# Patient Record
Sex: Female | Born: 1935 | Race: Black or African American | Hispanic: No | Marital: Single | Smoking: Never smoker
Health system: Southern US, Community
[De-identification: ages and names within clinical notes are randomized; demographics above are authoritative.]

## PROBLEM LIST (undated history)

## (undated) DIAGNOSIS — F0390 Unspecified dementia without behavioral disturbance: Secondary | ICD-10-CM

## (undated) DIAGNOSIS — I152 Hypertension secondary to endocrine disorders: Secondary | ICD-10-CM

## (undated) DIAGNOSIS — E1169 Type 2 diabetes mellitus with other specified complication: Secondary | ICD-10-CM

## (undated) DIAGNOSIS — E119 Type 2 diabetes mellitus without complications: Secondary | ICD-10-CM

## (undated) DIAGNOSIS — N1832 Chronic kidney disease, stage 3b: Secondary | ICD-10-CM

---

## 2020-09-14 ENCOUNTER — Emergency Department (HOSPITAL_COMMUNITY)
Admission: EM | Admit: 2020-09-14 | Discharge: 2020-09-14 | Disposition: A | Discharge status: Home / Self Care | Payer: Medicare PPO | Attending: Emergency Medicine | Admitting: Emergency Medicine

## 2020-09-14 ENCOUNTER — Emergency Department (HOSPITAL_COMMUNITY): Payer: Medicare PPO

## 2020-09-14 ENCOUNTER — Other Ambulatory Visit: Payer: Self-pay

## 2020-09-14 DIAGNOSIS — S8011XA Contusion of right lower leg, initial encounter: Secondary | ICD-10-CM | POA: Insufficient documentation

## 2020-09-14 DIAGNOSIS — F0281 Dementia in other diseases classified elsewhere with behavioral disturbance: Secondary | ICD-10-CM | POA: Diagnosis not present

## 2020-09-14 DIAGNOSIS — S8012XA Contusion of left lower leg, initial encounter: Secondary | ICD-10-CM | POA: Insufficient documentation

## 2020-09-14 DIAGNOSIS — S300XXA Contusion of lower back and pelvis, initial encounter: Secondary | ICD-10-CM | POA: Diagnosis not present

## 2020-09-14 DIAGNOSIS — S0083XA Contusion of other part of head, initial encounter: Secondary | ICD-10-CM | POA: Insufficient documentation

## 2020-09-14 DIAGNOSIS — W19XXXA Unspecified fall, initial encounter: Secondary | ICD-10-CM | POA: Insufficient documentation

## 2020-09-14 DIAGNOSIS — S0990XA Unspecified injury of head, initial encounter: Secondary | ICD-10-CM | POA: Diagnosis present

## 2020-09-14 DIAGNOSIS — G309 Alzheimer's disease, unspecified: Secondary | ICD-10-CM | POA: Insufficient documentation

## 2020-09-14 DIAGNOSIS — Y9289 Other specified places as the place of occurrence of the external cause: Secondary | ICD-10-CM | POA: Insufficient documentation

## 2020-09-14 DIAGNOSIS — R7989 Other specified abnormal findings of blood chemistry: Secondary | ICD-10-CM | POA: Diagnosis not present

## 2020-09-14 DIAGNOSIS — R059 Cough, unspecified: Secondary | ICD-10-CM | POA: Insufficient documentation

## 2020-09-14 DIAGNOSIS — R0981 Nasal congestion: Secondary | ICD-10-CM | POA: Diagnosis not present

## 2020-09-14 DIAGNOSIS — R296 Repeated falls: Secondary | ICD-10-CM

## 2020-09-14 LAB — CBC WITH DIFFERENTIAL/PLATELET
Abs Immature Granulocytes: 0.03 10*3/uL (ref 0.00–0.07)
Basophils Absolute: 0 10*3/uL (ref 0.0–0.1)
Basophils Relative: 0 %
Eosinophils Absolute: 0.3 10*3/uL (ref 0.0–0.5)
Eosinophils Relative: 4 %
HCT: 31.9 % — ABNORMAL LOW (ref 36.0–46.0)
Hemoglobin: 9.9 g/dL — ABNORMAL LOW (ref 12.0–15.0)
Immature Granulocytes: 0 %
Lymphocytes Relative: 8 %
Lymphs Abs: 0.6 10*3/uL — ABNORMAL LOW (ref 0.7–4.0)
MCH: 27.3 pg (ref 26.0–34.0)
MCHC: 31 g/dL (ref 30.0–36.0)
MCV: 87.9 fL (ref 80.0–100.0)
Monocytes Absolute: 0.5 10*3/uL (ref 0.1–1.0)
Monocytes Relative: 6 %
Neutro Abs: 6 10*3/uL (ref 1.7–7.7)
Neutrophils Relative %: 82 %
Platelets: 298 10*3/uL (ref 150–400)
RBC: 3.63 MIL/uL — ABNORMAL LOW (ref 3.87–5.11)
RDW: 16.7 % — ABNORMAL HIGH (ref 11.5–15.5)
WBC: 7.5 10*3/uL (ref 4.0–10.5)
nRBC: 0 % (ref 0.0–0.2)

## 2020-09-14 LAB — URINALYSIS, ROUTINE W REFLEX MICROSCOPIC
Bilirubin Urine: NEGATIVE
Glucose, UA: NEGATIVE mg/dL
Hgb urine dipstick: NEGATIVE
Ketones, ur: NEGATIVE mg/dL
Leukocytes,Ua: NEGATIVE
Nitrite: NEGATIVE
Protein, ur: >=300 mg/dL — AB
Specific Gravity, Urine: 1.012 (ref 1.005–1.030)
pH: 6 (ref 5.0–8.0)

## 2020-09-14 LAB — COMPREHENSIVE METABOLIC PANEL
ALT: 29 U/L (ref 0–44)
AST: 51 U/L — ABNORMAL HIGH (ref 15–41)
Albumin: 2.9 g/dL — ABNORMAL LOW (ref 3.5–5.0)
Alkaline Phosphatase: 56 U/L (ref 38–126)
Anion gap: 9 (ref 5–15)
BUN: 23 mg/dL (ref 8–23)
CO2: 23 mmol/L (ref 22–32)
Calcium: 8.5 mg/dL — ABNORMAL LOW (ref 8.9–10.3)
Chloride: 111 mmol/L (ref 98–111)
Creatinine, Ser: 2.09 mg/dL — ABNORMAL HIGH (ref 0.44–1.00)
GFR, Estimated: 23 mL/min — ABNORMAL LOW (ref >60)
Glucose, Bld: 150 mg/dL — ABNORMAL HIGH (ref 70–99)
Potassium: 4.6 mmol/L (ref 3.5–5.1)
Sodium: 143 mmol/L (ref 135–145)
Total Bilirubin: 0.7 mg/dL (ref 0.3–1.2)
Total Protein: 6 g/dL — ABNORMAL LOW (ref 6.5–8.1)

## 2020-09-14 NOTE — Discharge Instructions (Addendum)
You were seen in the ED for back and arm pain, recent fall  ER work-up was reassuring.  Your hemoglobin showed mild anemia and it was 9.9.  Your creatinine was elevated and it was 2.09, GFR was 23.  Your BUN was normal.  Encourage oral fluids and keep hydrated   We do not have available records to know if the above lab findings are at your baseline however they do not appear to be severely abnormal  Please have primary care doctor follow-up for repeat lab work in and repeat evaluation to ensure symptoms are improving

## 2020-09-14 NOTE — ED Notes (Signed)
Patient family member verbalizes understanding of discharge instructions. Opportunity for questioning and answers were provided. Armband removed by staff, pt discharged from ED.

## 2020-09-14 NOTE — ED Triage Notes (Signed)
Patient arrives to ED with complaints of fall on Thursday at [facility]. Pt has hematoma to upper left forehead, no thinners. Per patient she has been feeling weak and had a cough over the past week.

## 2020-09-14 NOTE — ED Provider Notes (Signed)
[facility] EMERGENCY DEPARTMENT Provider Note   CSN: [id number] Arrival date & time: 09/14/20  6599     History Chief Complaint  Patient presents with  . Fall  . Weakness    Diana Tran is a 86 y.o. female with history of Alzheimer's dementia is brought to the ED by her daughter for evaluation of back and arm pains.  Onset 2 days ago.  Two days ago patient had a fall at [place] [facility] day program.  Staff saw patient fall forward.  She goes to day program at the memory care unit with her husband who also has dementia.  She has a bruise in the left forehead.  Patient started complaining of back and arm pain the day of the fall.  Daughter states every time she gets up or is touched in the back she says "they are broken" and complains of pain.  Patient has been given Tylenol.  Daughter states patient had been dealing with a cough and chest congestion for about a month.  Was getting DayQuil and NyQuil.  Seen recently by primary care doctor who prescribed her antibiotics and promethazine last week, completed.  Cough has actually significantly improved.  No fevers.  Daughter is concerned given recent cough and now complaints of back pain that may be patient has pneumonia.  However, states her cough has improved and she has not had any fevers.  Daughter reports patient has long history of frequent falls.  Had a hip fracture recently and just finished PT.  Patient has several bruises in her buttocks and legs.  Daughter states patient felt 3 times from her bed last night which is not atypical for her.  Patient gets all of her care through [facility].  Daughter is currently moving to [facility] to be closer to patient and her father and assist with her care.  Patient otherwise appears to be at her baseline.  Typically she is only oriented to her name.  Has been eating and drinking okay.  Normal urine and bowel movement output.  No blood thinners.  Remote history of  cigarette use 50 years ago.  No known lung disease.  Patient is fully vaccinated and had a negative Covid test 2 days ago. Level 5 caveat due to dementia. Patient can tell me her name only, tells me the woman next to her is her older sister but she is in fact her daughter.    HPI     No past medical history on file.  There are no problems to display for this patient.   ** The histories are not reviewed yet. Please review them in the "History" navigator section and refresh this [place].   OB History   No obstetric history on file.     No family history on file.  Social History   Tobacco Use  . Smoking status: Not on file  Substance Use Topics  . Alcohol use: Not on file  . Drug use: Not on file    Home Medications Prior to Admission medications   Not on File    Allergies    Patient has no known allergies.  Review of Systems   Review of Systems  Unable to perform ROS: Dementia  All other systems reviewed and are negative.   Physical Exam Updated Vital Signs BP 120/77 (BP Location: Left Arm)   Pulse 64   Temp 97.8 F (36.6 C) (Axillary)   Resp 16   Ht 5\' 2"  (1.575 m)  Wt 49.9 kg   SpO2 97%   BMI 20.12 kg/m   Physical Exam Vitals and nursing note reviewed.  Constitutional:      Appearance: She is well-developed.     Comments: Nontoxic, pleasantly confused.  HENT:     Head: Normocephalic and atraumatic.     Nose: Nose normal.  Eyes:     Conjunctiva/sclera: Conjunctivae normal.  Cardiovascular:     Rate and Rhythm: Normal rate and regular rhythm.  Pulmonary:     Effort: Pulmonary effort is normal.     Breath sounds: Normal breath sounds.  Abdominal:     General: Bowel sounds are normal.     Palpations: Abdomen is soft.     Tenderness: There is no abdominal tenderness.     Comments: No G/R/R. No suprapubic or CVA tenderness. Negative Murphy's and McBurney's. Active BS to lower quadrants.   Musculoskeletal:        General: Normal range of  motion.     Cervical back: Normal range of motion.     Comments: Patient able to sit up independently on the bed.  No reproducible CTL spine midline or paraspinal muscle tenderness, contusions.  No palpable abnormalities or reproducible tenderness of the upper extremities, shoulders, scapula, APL thorax.  Passive range of motion of all upper/lower extremities without any pain.  Skin:    General: Skin is warm and dry.     Capillary Refill: Capillary refill takes less than 2 seconds.     Comments: Several small ecchymotic areas in the buttocks and lower legs, in different stages of healing.  Neurological:     Mental Status: She is alert. She is disoriented.     Comments: Patient able to tell me her name.  Disoriented to year, place and her daughter.  Does attempt to follow some simple commands.  5/5 strength with handgrip.  Patient able to lift both legs off the bed without any drift or weakness.  Cranial nerves intact bilaterally, visual fields unable to be tested due to patient cooperation.  Finger-to-nose and heel-to-shin unable to be tested due to patient cooperation.  Speech is clear.  Psychiatric:        Behavior: Behavior normal.     ED Results / Procedures / Treatments   Labs (all labs ordered are listed, but only abnormal results are displayed) Labs Reviewed  CBC WITH DIFFERENTIAL/PLATELET - Abnormal; Notable for the following components:      Result Value   RBC 3.63 (*)    Hemoglobin 9.9 (*)    HCT 31.9 (*)    RDW 16.7 (*)    Lymphs Abs 0.6 (*)    All other components within normal limits  COMPREHENSIVE METABOLIC PANEL - Abnormal; Notable for the following components:   Glucose, Bld 150 (*)    Creatinine, Ser 2.09 (*)    Calcium 8.5 (*)    Total Protein 6.0 (*)    Albumin 2.9 (*)    AST 51 (*)    GFR, Estimated 23 (*)    All other components within normal limits  URINALYSIS, ROUTINE W REFLEX MICROSCOPIC - Abnormal; Notable for the following components:   Protein, ur >=300  (*)    Bacteria, UA RARE (*)    All other components within normal limits  URINE CULTURE    EKG EKG Interpretation  Date/Time:  Sunday September 14 2020 09:43:42 EDT Ventricular Rate:  56 PR Interval:    QRS Duration: 87 QT Interval:  485 QTC Calculation: 469 R Axis:   -  50 Text Interpretation: Sinus rhythm Left anterior fascicular block Minimal ST depression no prior for comparison No STEMI Confirmed by Nanda Quinton [phone]) on 09/14/2020 9:53:45 AM   Radiology DG Chest 2 View  Result Date: 09/14/2020 CLINICAL DATA:  Back pain.  Cough. EXAM: CHEST - 2 VIEW COMPARISON:  None. FINDINGS: Normal heart size and mediastinal contours. Artifact from EKG pads. No acute infiltrate or edema. No effusion or pneumothorax. No acute osseous findings; the spine is better visualized on dedicated images. Degenerative endplate spurring and disc narrowing which is diffuse. IMPRESSION: No active cardiopulmonary disease. Electronically Signed   By: Monte Fantasia M.D.   On: 09/14/2020 10:45   DG Thoracic Spine 2 View  Result Date: 09/14/2020 CLINICAL DATA:  Back pain, cough.  Recent fall. EXAM: THORACIC SPINE 2 VIEWS COMPARISON:  None. FINDINGS: Moderate dextroscoliosis of the mid/upper thoracic spine. Wedging of multiple vertebral bodies within the midthoracic spine, with associated thoracic spine kyphosis, most likely chronic given the associated degenerative spurring and endplate sclerosis. No acute appearing fracture or subluxation. Visualized paravertebral soft tissues are unremarkable. IMPRESSION: 1. No acute findings. No acute appearing fracture or subluxation within the thoracic spine. 2. Chronic-appearing wedging of multiple vertebral bodies within the midthoracic spine, with associated thoracic spine kyphosis. 3. Moderate dextroscoliosis of the mid/upper thoracic spine. 4. Degenerative changes within the mid and upper thoracic spine, mild to moderate in degree. Electronically Signed   By: Franki Cabot  M.D.   On: 09/14/2020 10:49   DG Lumbar Spine Complete  Result Date: 09/14/2020 CLINICAL DATA:  Fall, back pain. EXAM: LUMBAR SPINE - COMPLETE 4+ VIEW COMPARISON:  None. FINDINGS: Moderate S-shaped scoliosis of the thoracolumbar spine. Mild vertebral body subluxations at the L3 and L4 levels, likely related to underlying degenerative change. No evidence of acute vertebral body subluxation. No fracture line or displaced fracture fragment. Degenerative spondylosis throughout the lumbar spine, at least moderate in degree with associated disc space narrowings, osseous spurring and endplate sclerosis. Additional degenerative hypertrophy of the facet joints at multiple levels. Aortic atherosclerosis. Visualized paravertebral soft tissues are otherwise unremarkable. IMPRESSION: 1. No acute findings. No fracture or evidence of acute subluxation seen within the lumbar spine. 2. Moderate scoliosis. 3. Degenerative spondylosis of the lumbar spine, at least moderate in degree. 4. Aortic atherosclerosis. Electronically Signed   By: Franki Cabot M.D.   On: 09/14/2020 10:51   CT Head Wo Contrast  Result Date: 09/14/2020 CLINICAL DATA:  Fall, hematoma at upper LEFT forehead. EXAM: CT HEAD WITHOUT CONTRAST TECHNIQUE: Contiguous axial images were obtained from the base of the skull through the vertex without intravenous contrast. COMPARISON:  None. FINDINGS: Brain: Generalized age related parenchymal volume loss with commensurate dilatation of the ventricles and sulci. Chronic small vessel ischemic changes within the bilateral periventricular and subcortical white matter regions. No mass, hemorrhage, edema or other evidence of acute parenchymal abnormality. No extra-axial hemorrhage. Vascular: Chronic calcified atherosclerotic changes of the large vessels at the skull base. No unexpected hyperdense vessel. Skull: Normal. Negative for fracture or focal lesion. Sinuses/Orbits: No acute finding. Other: None. IMPRESSION: 1. No  acute findings. No intracranial mass, hemorrhage or edema. No skull fracture. 2. Chronic small vessel ischemic changes in the white matter. Electronically Signed   By: Franki Cabot M.D.   On: 09/14/2020 10:53    Procedures Procedures (including critical care time)  Medications Ordered in ED Medications - No data to display  ED Course  I have reviewed the triage vital signs and the  nursing notes.  Pertinent labs & imaging results that were available during my care of the patient were reviewed by me and considered in my medical decision making (see chart for details).  Clinical Course as of Sep 14 1252  Sun Sep 14, 2020  1130 Hemoglobin(!): 9.9 [CG]  1131 HCT(!): 31.9 [CG]  1131 Creatinine(!): 2.09 [CG]  1131 Bacteria, UA(!): RARE [CG]    Clinical Course User Index [CG] Arlean Hopping   MDM Rules/Calculators/A&P                          86 year old female with history of dementia and frequent falls presents to the ED for back and arm pains after a mechanical fall 2 days ago.  History obtained from daughter who is at bedside.   No available medical records, patient is relocating and gets the majority of her care at [facility].  Exam is benign.  Afebrile.  Vital signs normal.  Unable to reproduce tenderness on exam.  No signs of severe trauma.  Patient is able to sit up move around in bed.  Neurological exam is limited due to dementia but grossly reassuring.  I ordered lab work including CBC, CMP, urinalysis and urine culture.  I have ordered imaging studies including head CT, lumbar thoracic spine x-rays, chest x-ray, EKG.  ER work-up personally visualized and interpreted.  ER work-up reveals anemia hemoglobin 9.9.  Creatinine 2.09, GFR 23 but normal BUN.  Chest x-ray without acute findings.  Urinalysis with rare bacteria only.  X-rays and head CT without acute findings.  Discussed with patient's daughter lab findings.  States in the past she has had to be admitted  for acute kidney injury but does not know patient's baseline creatinine or hemoglobin.  These may be her baseline.  Reevaluated patient and patient has been stable, no clinical decline.  Vital signs remain normal.  Given benign exam, lab work patient will be discharged.  Recommended close PCP follow-up for repeat lab work, reassessment.  Daughter is in agreement with plan of care.  Shared with EDP long who evaluated patient and agrees with ER management and discharge plan.   Final Clinical Impression(s) / ED Diagnoses Final diagnoses:  Frequent falls  Elevated serum creatinine  Contusion of forehead, initial encounter    Rx / DC Orders ED Discharge Orders    None       Kinnie Feil, PA-C 09/14/20 1253    Long, W[facility], MD 09/15/20 1350

## 2020-09-15 LAB — URINE CULTURE: Culture: NO GROWTH

## 2020-09-24 ENCOUNTER — Emergency Department (HOSPITAL_COMMUNITY): Payer: Medicare PPO

## 2020-09-24 ENCOUNTER — Encounter (HOSPITAL_COMMUNITY): Payer: Self-pay | Admitting: Physician Assistant

## 2020-09-24 ENCOUNTER — Emergency Department (HOSPITAL_COMMUNITY)
Admission: EM | Admit: 2020-09-24 | Discharge: 2020-09-24 | Disposition: A | Discharge status: Home / Self Care | Payer: Medicare PPO | Attending: Emergency Medicine | Admitting: Emergency Medicine

## 2020-09-24 ENCOUNTER — Other Ambulatory Visit: Payer: Self-pay

## 2020-09-24 DIAGNOSIS — R404 Transient alteration of awareness: Secondary | ICD-10-CM

## 2020-09-24 DIAGNOSIS — Z79899 Other long term (current) drug therapy: Secondary | ICD-10-CM | POA: Diagnosis not present

## 2020-09-24 DIAGNOSIS — R4182 Altered mental status, unspecified: Secondary | ICD-10-CM | POA: Diagnosis present

## 2020-09-24 DIAGNOSIS — I10 Essential (primary) hypertension: Secondary | ICD-10-CM | POA: Insufficient documentation

## 2020-09-24 DIAGNOSIS — F0390 Unspecified dementia without behavioral disturbance: Secondary | ICD-10-CM | POA: Insufficient documentation

## 2020-09-24 LAB — URINALYSIS, ROUTINE W REFLEX MICROSCOPIC
Bacteria, UA: NONE SEEN
Bilirubin Urine: NEGATIVE
Glucose, UA: 50 mg/dL — AB
Hgb urine dipstick: NEGATIVE
Ketones, ur: NEGATIVE mg/dL
Leukocytes,Ua: NEGATIVE
Nitrite: NEGATIVE
Protein, ur: >=300 mg/dL — AB
Specific Gravity, Urine: 1.011 (ref 1.005–1.030)
pH: 7 (ref 5.0–8.0)

## 2020-09-24 LAB — COMPREHENSIVE METABOLIC PANEL
ALT: 21 U/L (ref 0–44)
AST: 20 U/L (ref 15–41)
Albumin: 2.9 g/dL — ABNORMAL LOW (ref 3.5–5.0)
Alkaline Phosphatase: 63 U/L (ref 38–126)
Anion gap: 11 (ref 5–15)
BUN: 29 mg/dL — ABNORMAL HIGH (ref 8–23)
CO2: 22 mmol/L (ref 22–32)
Calcium: 8.6 mg/dL — ABNORMAL LOW (ref 8.9–10.3)
Chloride: 109 mmol/L (ref 98–111)
Creatinine, Ser: 1.86 mg/dL — ABNORMAL HIGH (ref 0.44–1.00)
GFR, Estimated: 26 mL/min — ABNORMAL LOW (ref >60)
Glucose, Bld: 212 mg/dL — ABNORMAL HIGH (ref 70–99)
Potassium: 4.6 mmol/L (ref 3.5–5.1)
Sodium: 142 mmol/L (ref 135–145)
Total Bilirubin: 0.4 mg/dL (ref 0.3–1.2)
Total Protein: 6.1 g/dL — ABNORMAL LOW (ref 6.5–8.1)

## 2020-09-24 LAB — CBC WITH DIFFERENTIAL/PLATELET
Abs Immature Granulocytes: 0.02 10*3/uL (ref 0.00–0.07)
Basophils Absolute: 0 10*3/uL (ref 0.0–0.1)
Basophils Relative: 1 %
Eosinophils Absolute: 0.2 10*3/uL (ref 0.0–0.5)
Eosinophils Relative: 3 %
HCT: 33.6 % — ABNORMAL LOW (ref 36.0–46.0)
Hemoglobin: 10.2 g/dL — ABNORMAL LOW (ref 12.0–15.0)
Immature Granulocytes: 0 %
Lymphocytes Relative: 13 %
Lymphs Abs: 0.9 10*3/uL (ref 0.7–4.0)
MCH: 27 pg (ref 26.0–34.0)
MCHC: 30.4 g/dL (ref 30.0–36.0)
MCV: 88.9 fL (ref 80.0–100.0)
Monocytes Absolute: 0.4 10*3/uL (ref 0.1–1.0)
Monocytes Relative: 6 %
Neutro Abs: 5.1 10*3/uL (ref 1.7–7.7)
Neutrophils Relative %: 77 %
Platelets: 357 10*3/uL (ref 150–400)
RBC: 3.78 MIL/uL — ABNORMAL LOW (ref 3.87–5.11)
RDW: 16.5 % — ABNORMAL HIGH (ref 11.5–15.5)
WBC: 6.6 10*3/uL (ref 4.0–10.5)
nRBC: 0 % (ref 0.0–0.2)

## 2020-09-24 NOTE — ED Notes (Signed)
Pt disconnected self from monitor and pulled out PIV. Pt reoriented and placed back on cardiac monitor. No PIV at this time.

## 2020-09-24 NOTE — ED Notes (Signed)
In order to ensure pt safety and keep patient from wandering, pt sitting at nurses station with mask in place.

## 2020-09-24 NOTE — ED Notes (Signed)
Pt disconnected self from monitor.

## 2020-09-24 NOTE — ED Notes (Addendum)
Pt discharged ambulatory with daughter. All questions and concerns addressed. No complaints at this time.

## 2020-09-24 NOTE — ED Triage Notes (Signed)
BIB [facility] coming from adult daycare facility. Staff noticed that after eating lunch she became unresponsive for about 7 minutes. Staff did sternum rub and the patient came to. Per EMS she is not able to hold a good conversation.  EMS reports AO x1 to self which is baseline. Staff became concerned because while she usually does doze off she is usually easily woken up.   BP-178/94 CBG-284 HR-80s Resp- 18

## 2020-09-24 NOTE — Discharge Instructions (Addendum)
Please follow up with your primary care provider within 5-7 days for re-evaluation of your symptoms. If you do not have a primary care provider, information for a healthcare clinic has been provided for you to make arrangements for follow up care. Please return to the emergency department for any new or worsening symptoms. ° °

## 2020-09-24 NOTE — ED Notes (Signed)
Pt daughter here. Pt placed back in ED05 with daughter at bedside. Will notify provider.

## 2020-09-24 NOTE — ED Provider Notes (Signed)
  Face-to-face evaluation   History: Elderly female, at daycare today when she was sleepy and caregivers had difficulty arousing her.  Physical exam: Elderly, confused, no apparent distress.  She is removing stickers in leads, from her body.  She is pleasant but quite confused.  No dysarthria or aphasia.  Medical screening examination/treatment/procedure(s) were conducted as a shared visit with non-physician practitioner(s) and myself.  I personally evaluated the patient during the encounter    Daleen Bo, MD 09/24/20 2200

## 2020-09-24 NOTE — ED Notes (Signed)
Pt disconnected self from cardiac monitor. Pt reoriented

## 2020-09-24 NOTE — ED Notes (Signed)
Pt resting in stretcher with no complaints at this time. Pt is oriented to self only-- baseline per adult daycare staff.   Pt hypertensive-- hooked up to cardiac monitor all other VS WDL

## 2020-09-24 NOTE — ED Provider Notes (Signed)
[facility] EMERGENCY DEPARTMENT Provider Note   CSN: [id number] Arrival date & time: 09/24/20  1228     History Chief Complaint  Patient presents with  . Altered Mental Status    Diana Tran is a 86 y.o. female.  HPI    86 y/o female with a h/o dementia who presents to the ED today for eval of an episode of nonresponsiveness.   The patient has a history of dementia and therefore a level 5 caveat applies.  Per EMS patient had an episode of nonresponsiveness that lasted about 7 minutes.  She had some hypertension and hyperglycemia in route but otherwise vital signs are reassuring.  1:24 PM Discussed case with the patients daughter, Rosalene Billings who states that patient has not been taking her medications for the last 2 days. She has had to hide her medications in her foods. She was noted to have elevated Cr at that visit and daughter is concerned that she has not been drinking water.  She has been eating normally.  She made a conference call to the facility so that I could obtain more information.  See below.  1:33 PM discussed case with patient's facility, [facility].  They state that the patient was not in activity and was falling asleep on and off for several hours.  When they tried to wake her up to get her to go to lunch she had difficulty waking up and was nonresponsive to sternal rub for about 7 to 10 minutes.  When she became responsive she was acting her normal self and was in no acute distress.  Her vital signs were stable at the time of their evaluation but they sent her here for further evaluation.   History reviewed. No pertinent past medical history.  There are no problems to display for this patient.   History reviewed. No pertinent surgical history.   OB History   No obstetric history on file.     History reviewed. No pertinent family history.  Social History   Tobacco Use  . Smoking status: Not on file  Substance Use Topics  .  Alcohol use: Not on file  . Drug use: Not on file    Home Medications Prior to Admission medications   Medication Sig Start Date End Date Taking? Authorizing Provider  amLODipine (NORVASC) 5 MG tablet Take 5 mg by mouth daily. 09/03/20  Yes [provider]  atorvastatin (LIPITOR) 40 MG tablet Take 40 mg by mouth at bedtime.   Yes [provider]  carvedilol (COREG) 6.25 MG tablet Take 6.25 mg by mouth 2 (two) times daily with a meal.   Yes [provider]  LUMIGAN 0.01 % SOLN Place 1 drop into both eyes at bedtime.  07/04/20  Yes [provider]  meclizine (ANTIVERT) 12.5 MG tablet Take 12.5 mg by mouth 2 (two) times daily with a meal.  09/12/20  Yes [provider]  metFORMIN (GLUCOPHAGE) 500 MG tablet Take 500 mg by mouth daily with breakfast.  09/02/20  Yes [provider]  solifenacin (VESICARE) 5 MG tablet Take 5 mg by mouth daily. 09/15/20  Yes [provider]    Allergies    Amlodipine, Galantamine, and Memantine  Review of Systems   Review of Systems  Unable to perform ROS: Dementia  Respiratory: Negative for cough and shortness of breath.   Cardiovascular: Negative for chest pain.  Gastrointestinal: Negative for abdominal pain.  Neurological: Negative for headaches.    Physical Exam  Updated Vital Signs BP (!) 176/89   Pulse 61   Temp 97.9 F (36.6 C) (Oral)   Resp 14   Ht 5\' 2"  (1.575 m)   Wt 49.9 kg   SpO2 100%   BMI 20.12 kg/m   Physical Exam Vitals and nursing note reviewed.  Constitutional:      General: She is not in acute distress.    Appearance: She is well-developed.  HENT:     Head: Normocephalic and atraumatic.  Eyes:     Conjunctiva/sclera: Conjunctivae normal.  Cardiovascular:     Rate and Rhythm: Normal rate and regular rhythm.     Heart sounds: Normal heart sounds. No murmur heard.   Pulmonary:     Effort: Pulmonary effort is normal. No respiratory distress.     Breath sounds:  Normal breath sounds.  Abdominal:     General: Bowel sounds are normal.     Palpations: Abdomen is soft.     Tenderness: There is no abdominal tenderness. There is no guarding or rebound.  Musculoskeletal:     Cervical back: Neck supple.  Skin:    General: Skin is warm and dry.  Neurological:     Mental Status: She is alert.     Comments: Oriented to self.  Pleasantly demented.  Moving all extremities with normal strength and sensation throughout.  Ambulatory with steady gait.  No facial droop.  Clear speech.     ED Results / Procedures / Treatments   Labs (all labs ordered are listed, but only abnormal results are displayed) Labs Reviewed  CBC WITH DIFFERENTIAL/PLATELET - Abnormal; Notable for the following components:      Result Value   RBC 3.78 (*)    Hemoglobin 10.2 (*)    HCT 33.6 (*)    RDW 16.5 (*)    All other components within normal limits  COMPREHENSIVE METABOLIC PANEL - Abnormal; Notable for the following components:   Glucose, Bld 212 (*)    BUN 29 (*)    Creatinine, Ser 1.86 (*)    Calcium 8.6 (*)    Total Protein 6.1 (*)    Albumin 2.9 (*)    GFR, Estimated 26 (*)    All other components within normal limits  URINALYSIS, ROUTINE W REFLEX MICROSCOPIC - Abnormal; Notable for the following components:   Glucose, UA 50 (*)    Protein, ur >=300 (*)    All other components within normal limits    EKG None  Radiology CT Head Wo Contrast  Result Date: 09/24/2020 CLINICAL DATA:  Altered mental status. EXAM: CT HEAD WITHOUT CONTRAST TECHNIQUE: Contiguous axial images were obtained from the base of the skull through the vertex without intravenous contrast. COMPARISON:  September 14, 2020. FINDINGS: Brain: Mild diffuse cortical atrophy is noted. Mild chronic ischemic white matter disease is noted. No mass effect or midline shift is noted. Ventricular size is within normal limits. There is no evidence of mass lesion, hemorrhage or acute infarction. Vascular: No  hyperdense vessel or unexpected calcification. Skull: Normal. Negative for fracture or focal lesion. Sinuses/Orbits: No acute finding. Other: None. IMPRESSION: Mild diffuse cortical atrophy. Mild chronic ischemic white matter disease. No acute intracranial abnormality seen. Electronically Signed   By: Marijo Conception M.D.   On: 09/24/2020 14:48   DG Chest Port 1 View  Result Date: 09/24/2020 CLINICAL DATA:  Syncope.  Altered mental status. EXAM: PORTABLE CHEST 1 VIEW COMPARISON:  09/14/2020. FINDINGS: Similar cardiomediastinal silhouette. Aortic atherosclerosis. No consolidation. Linear left basilar  opacities. No visible pleural effusions or pneumothorax. Degenerative changes of the thoracic spine and bilateral shoulders. IMPRESSION: Left basilar atelectasis. Otherwise, no acute cardiopulmonary abnormality. Electronically Signed   By: Margaretha Sheffield MD   On: 09/24/2020 12:59    Procedures Procedures (including critical care time)  Medications Ordered in ED Medications - No data to display  ED Course  I have reviewed the triage vital signs and the nursing notes.  Pertinent labs & imaging results that were available during my care of the patient were reviewed by me and considered in my medical decision making (see chart for details).    MDM Rules/Calculators/A&P                          86 year old female presented emergency department today for evaluation of a transient alteration of awareness.  She was nonresponsive for several minutes after being asleep on and off for the last 2 hours prior to this event.  She was back to baseline immediately upon arousal.  Her vital signs here are generally reassuring although she is somewhat hypertensive.  Daughter admits the patient has not been taking her medication unless it is hidden food.  Reviewed/interpreted labs.  Patient mildly anemic, no leukocytosis.  Elevated BUN/creatinine but improved from prior labs 10 days ago.  CMP otherwise reassuring.   UA without evidence of UTI.  EKG without ischemic changes, arrhythmia or other abnormality.  Chest x-ray shows some atelectasis but no pneumonia.  CT head is without any acute findings.  Patient has been up ambulating in the department in no acute distress.  She has been eating and drinking normally here in the ED.  Discussed work-up and plan with patient's daughter at bedside.  She is concerned about the patient's health status given this is her second ED visit in the last 2 weeks.  I offered admission for further monitoring due to concern for possible syncope though she declined.  Also offered to consult transitions of care to help set up home health and she declined this as well.  She would prefer to take the patient home and monitor her from there.  Advised on close PCP follow-up and strict return precautions.  She voiced understanding the plan was to return.  All Questions answered.  Patient stable for discharge.   Final Clinical Impression(s) / ED Diagnoses Final diagnoses:  Transient alteration of awareness    Rx / DC Orders ED Discharge Orders    None       Rodney Booze, PA-C 09/24/20 1653    Daleen Bo, MD 09/24/20 2201

## 2020-09-24 NOTE — ED Notes (Signed)
Pt found by staff member wandering hallway in bra and pants. Pt placed back in room. Safety sitter ordered; none available at this time per staffing office.

## 2021-06-13 ENCOUNTER — Other Ambulatory Visit: Payer: Self-pay

## 2021-06-13 ENCOUNTER — Emergency Department (HOSPITAL_COMMUNITY): Payer: Medicare PPO

## 2021-06-13 ENCOUNTER — Emergency Department (HOSPITAL_COMMUNITY)
Admission: EM | Admit: 2021-06-13 | Discharge: 2021-06-13 | Disposition: A | Discharge status: Home / Self Care | Payer: Medicare PPO | Attending: Emergency Medicine | Admitting: Emergency Medicine

## 2021-06-13 DIAGNOSIS — Z79899 Other long term (current) drug therapy: Secondary | ICD-10-CM | POA: Diagnosis not present

## 2021-06-13 DIAGNOSIS — R29810 Facial weakness: Secondary | ICD-10-CM | POA: Insufficient documentation

## 2021-06-13 DIAGNOSIS — R220 Localized swelling, mass and lump, head: Secondary | ICD-10-CM | POA: Diagnosis not present

## 2021-06-13 DIAGNOSIS — R600 Localized edema: Secondary | ICD-10-CM

## 2021-06-13 DIAGNOSIS — F0390 Unspecified dementia without behavioral disturbance: Secondary | ICD-10-CM | POA: Diagnosis not present

## 2021-06-13 DIAGNOSIS — I639 Cerebral infarction, unspecified: Secondary | ICD-10-CM | POA: Insufficient documentation

## 2021-06-13 LAB — I-STAT CHEM 8, ED
BUN: 35 mg/dL — ABNORMAL HIGH (ref 8–23)
Calcium, Ion: 1.14 mmol/L — ABNORMAL LOW (ref 1.15–1.40)
Chloride: 109 mmol/L (ref 98–111)
Creatinine, Ser: 2.3 mg/dL — ABNORMAL HIGH (ref 0.44–1.00)
Glucose, Bld: 166 mg/dL — ABNORMAL HIGH (ref 70–99)
HCT: 32 % — ABNORMAL LOW (ref 36.0–46.0)
Hemoglobin: 10.9 g/dL — ABNORMAL LOW (ref 12.0–15.0)
Potassium: 4.5 mmol/L (ref 3.5–5.1)
Sodium: 142 mmol/L (ref 135–145)
TCO2: 23 mmol/L (ref 22–32)

## 2021-06-13 LAB — CBC
HCT: 31.3 % — ABNORMAL LOW (ref 36.0–46.0)
Hemoglobin: 9.9 g/dL — ABNORMAL LOW (ref 12.0–15.0)
MCH: 27.5 pg (ref 26.0–34.0)
MCHC: 31.6 g/dL (ref 30.0–36.0)
MCV: 86.9 fL (ref 80.0–100.0)
Platelets: 337 10*3/uL (ref 150–400)
RBC: 3.6 MIL/uL — ABNORMAL LOW (ref 3.87–5.11)
RDW: 15.4 % (ref 11.5–15.5)
WBC: 10 10*3/uL (ref 4.0–10.5)
nRBC: 0 % (ref 0.0–0.2)

## 2021-06-13 LAB — DIFFERENTIAL
Abs Immature Granulocytes: 0.04 10*3/uL (ref 0.00–0.07)
Basophils Absolute: 0 10*3/uL (ref 0.0–0.1)
Basophils Relative: 0 %
Eosinophils Absolute: 0.2 10*3/uL (ref 0.0–0.5)
Eosinophils Relative: 2 %
Immature Granulocytes: 0 %
Lymphocytes Relative: 10 %
Lymphs Abs: 1 10*3/uL (ref 0.7–4.0)
Monocytes Absolute: 0.7 10*3/uL (ref 0.1–1.0)
Monocytes Relative: 7 %
Neutro Abs: 8.1 10*3/uL — ABNORMAL HIGH (ref 1.7–7.7)
Neutrophils Relative %: 81 %

## 2021-06-13 LAB — PROTIME-INR
INR: 1 (ref 0.8–1.2)
Prothrombin Time: 13.2 seconds (ref 11.4–15.2)

## 2021-06-13 LAB — COMPREHENSIVE METABOLIC PANEL
ALT: 14 U/L (ref 0–44)
AST: 15 U/L (ref 15–41)
Albumin: 2.7 g/dL — ABNORMAL LOW (ref 3.5–5.0)
Alkaline Phosphatase: 68 U/L (ref 38–126)
Anion gap: 5 (ref 5–15)
BUN: 36 mg/dL — ABNORMAL HIGH (ref 8–23)
CO2: 25 mmol/L (ref 22–32)
Calcium: 8.5 mg/dL — ABNORMAL LOW (ref 8.9–10.3)
Chloride: 109 mmol/L (ref 98–111)
Creatinine, Ser: 2.43 mg/dL — ABNORMAL HIGH (ref 0.44–1.00)
GFR, Estimated: 19 mL/min — ABNORMAL LOW (ref >60)
Glucose, Bld: 175 mg/dL — ABNORMAL HIGH (ref 70–99)
Potassium: 4.3 mmol/L (ref 3.5–5.1)
Sodium: 139 mmol/L (ref 135–145)
Total Bilirubin: 0.7 mg/dL (ref 0.3–1.2)
Total Protein: 5.9 g/dL — ABNORMAL LOW (ref 6.5–8.1)

## 2021-06-13 LAB — ETHANOL: Alcohol, Ethyl (B): <10 mg/dL (ref <10)

## 2021-06-13 LAB — APTT: aPTT: 26 seconds (ref 24–36)

## 2021-06-13 NOTE — Discharge Instructions (Addendum)
Call your primary care doctor or specialist as discussed in the next 2-3 days.   Return immediately back to the ER if:  Your symptoms worsen within the next 12-24 hours. You develop new symptoms such as new fevers, persistent vomiting, new pain, shortness of breath, or new weakness or numbness, or if you have any other concerns.  

## 2021-06-13 NOTE — ED Triage Notes (Signed)
Pt from home with hx of dementia for eval of facial swelling and droop noticed by family. LSN 2200 last night.

## 2021-06-13 NOTE — ED Provider Notes (Signed)
Emergency Medicine Provider Triage Evaluation Note  Diana Tran , a 87 y.o. female  was evaluated in triage.  Pt complains of left side facial swelling with possible droop.  History provided by daughter, patient with dementia. Last seen normal 10:00 last night.  This morning patient had swelling and slight droop of the left side face.  No other weakness.  Patient is ambulatory and has had normal gait per daughter.  Review of Systems  Positive: L side facial swelling Negative: fever  Physical Exam  There were no vitals taken for this visit. Gen:   Awake, no distress   Resp:  Normal effort  MSK:   Moves extremities without difficulty  Other:  Swelling noted of the left side face, mostly on the left eye.  There is slight asymmetry of the face, however no obvious droop when patient smiles.  However exam is difficult due to dementia.  No clear visual field deficit.  No obvious strength deficit of upper or lower extremities.  Sensation intact x4.  Medical Decision Making  Medically screening exam initiated at 1:09 PM.  Appropriate orders placed.  Diana Tran was informed that the remainder of the evaluation will be completed by another provider, this initial triage assessment does not replace that evaluation, and the importance of remaining in the ED until their evaluation is complete.  Due to possible new facial droop, stroke work up started. But pt not in window for tpa. No lvo   Franchot Heidelberg, PA-C 06/13/21 1311    Luna Fuse, MD 06/13/21 [phone]

## 2021-06-13 NOTE — ED Provider Notes (Signed)
[facility] EMERGENCY DEPARTMENT Provider Note   CSN: [id number] Arrival date & time: 06/13/21  1219     History Chief Complaint  Patient presents with   Facial Droop    Diana Tran is a 87 y.o. female.  Patient with history of severe Alzheimer's living in the memory unit presents with no complaints herself.  However her family is concerned that she has been having some left-sided facial droop which he noticed yesterday when he picked her up from her memory unit.  Patient lives in the memory unit Monday through Friday and the family picks her up for the weekends.  No reports of weakness of the arm or leg.  Patient has a normal gait per family.  No speech deficit.  No reports of any fevers or cough or vomiting or diarrhea.      No past medical history on file.  There are no problems to display for this patient.   No past surgical history on file.   OB History   No obstetric history on file.     No family history on file.     Home Medications Prior to Admission medications   Medication Sig Start Date End Date Taking? Authorizing Provider  amLODipine (NORVASC) 5 MG tablet Take 5 mg by mouth daily. 09/03/20   [provider]  atorvastatin (LIPITOR) 40 MG tablet Take 40 mg by mouth at bedtime.    [provider]  carvedilol (COREG) 6.25 MG tablet Take 6.25 mg by mouth 2 (two) times daily with a meal.    [provider]  LUMIGAN 0.01 % SOLN Place 1 drop into both eyes at bedtime.  07/04/20   [provider]  meclizine (ANTIVERT) 12.5 MG tablet Take 12.5 mg by mouth 2 (two) times daily with a meal.  09/12/20   [provider]  metFORMIN (GLUCOPHAGE) 500 MG tablet Take 500 mg by mouth daily with breakfast.  09/02/20   [provider]  solifenacin (VESICARE) 5 MG tablet Take 5 mg by mouth daily. 09/15/20   [provider]    Allergies    Amlodipine, Galantamine, and Memantine  Review of  Systems   Review of Systems  Unable to perform ROS: Dementia   Physical Exam Updated Vital Signs BP (!) 164/77 (BP Location: Right Arm)   Pulse 65   Temp 98.2 F (36.8 C) (Oral)   Resp 16   Ht 5' (1.524 m)   Wt 52.2 kg   SpO2 98%   BMI 22.46 kg/m   Physical Exam Constitutional:      General: She is not in acute distress.    Appearance: Normal appearance.  HENT:     Head: Normocephalic.     Comments: Patient does have some mild edema to the left upper eyelid and the left maxillary region.  No abnormal warmth no erythema or cellulitis noted no fluctuance noted.    Nose: Nose normal.  Eyes:     Extraocular Movements: Extraocular movements intact.  Cardiovascular:     Rate and Rhythm: Normal rate.  Pulmonary:     Effort: Pulmonary effort is normal.  Musculoskeletal:        General: Normal range of motion.     Cervical back: Normal range of motion.  Neurological:     General: No focal deficit present.     Mental Status: She is alert. Mental status is at baseline.     Comments: Cranial nerves II through XII appear intact.  Strength is 5/5 strength all extremities no drift or weakness noted.    ED Results / Procedures / Treatments   Labs (all labs ordered are listed, but only abnormal results are displayed) Labs Reviewed  CBC - Abnormal; Notable for the following components:      Result Value   RBC 3.60 (*)    Hemoglobin 9.9 (*)    HCT 31.3 (*)    All other components within normal limits  DIFFERENTIAL - Abnormal; Notable for the following components:   Neutro Abs 8.1 (*)    All other components within normal limits  COMPREHENSIVE METABOLIC PANEL - Abnormal; Notable for the following components:   Glucose, Bld 175 (*)    BUN 36 (*)    Creatinine, Ser 2.43 (*)    Calcium 8.5 (*)    Total Protein 5.9 (*)    Albumin 2.7 (*)    GFR, Estimated 19 (*)    All other components within normal limits  I-STAT CHEM 8, ED - Abnormal; Notable for the following components:    BUN 35 (*)    Creatinine, Ser 2.30 (*)    Glucose, Bld 166 (*)    Calcium, Ion 1.14 (*)    Hemoglobin 10.9 (*)    HCT 32.0 (*)    All other components within normal limits  ETHANOL  PROTIME-INR  APTT    EKG None  Radiology CT HEAD WO CONTRAST  Result Date: 06/13/2021 CLINICAL DATA:  Neuro deficit. Stroke suspected. Left-sided facial swelling. Possible facial droop. Patient has history of dementia. EXAM: CT HEAD WITHOUT CONTRAST TECHNIQUE: Contiguous axial images were obtained from the base of the skull through the vertex without intravenous contrast. COMPARISON:  September 24, 2020 FINDINGS: Brain: Prominence of sulci and ventricles is stable since September 24, 2020 consistent with cerebral atrophy. Cerebellum, brainstem, and basal cisterns demonstrate no acute abnormalities. White matter changes are noted. No acute cortical ischemia or infarct are identified. No mass effect or midline shift. Vascular: Calcified atherosclerosis in the intracranial carotids. Skull: Normal. Negative for fracture or focal lesion. Sinuses/Orbits: No acute finding. Other: No other abnormalities. IMPRESSION: 1. Cerebral atrophy and white matter changes. No acute intracranial abnormalities identified. Electronically Signed   By: Dorise Bullion III M.D   On: 06/13/2021 13:47    Procedures Procedures   Medications Ordered in ED Medications - No data to display  ED Course  I have reviewed the triage vital signs and the nursing notes.  Pertinent labs & imaging results that were available during my care of the patient were reviewed by me and considered in my medical decision making (see chart for details).    MDM Rules/Calculators/A&P                             Labs and work-up otherwise unremarkable.  CT imaging of the brain shows no acute findings per radiology.  I discussed at length with family regarding possibility of CVA, I feel this is less likely given she has no real focal deficit and the slight  facial droop on the left side likely due to the edema of the left eyelid and left maxillary region as opposed to her neurologic deficit.  Family states that she does sleep on the left side of her face which I suspect may have caused some edema to developed over time.  I do not suspect cellulitis given the nature of presentation here.  Risks and benefits of sedation for  MRI discussed with family and joint decision made to forego MRI at this time given low pretest probability.  I did discuss aspirin therapy with family, however they state that she had a bad reaction to aspirin they do not recall what it was but are hesitant to be started.  Given I see no clear focal deficit at this time should be discharged home.  Advise outpatient follow-up with her doctors as needed within the week.  I advised immediate return for worsening symptoms or any additional concerns.  Final Clinical Impression(s) / ED Diagnoses Final diagnoses:  Facial edema    Rx / DC Orders ED Discharge Orders     None        Luna Fuse, MD 06/13/21 1438

## 2021-07-10 ENCOUNTER — Emergency Department (HOSPITAL_COMMUNITY)
Admission: EM | Admit: 2021-07-10 | Discharge: 2021-07-10 | Disposition: A | Discharge status: Home / Self Care | Payer: Medicare PPO | Attending: Emergency Medicine | Admitting: Emergency Medicine

## 2021-07-10 ENCOUNTER — Encounter (HOSPITAL_COMMUNITY): Payer: Self-pay | Admitting: Emergency Medicine

## 2021-07-10 DIAGNOSIS — R220 Localized swelling, mass and lump, head: Secondary | ICD-10-CM | POA: Diagnosis not present

## 2021-07-10 DIAGNOSIS — R600 Localized edema: Secondary | ICD-10-CM

## 2021-07-10 MED ORDER — METHYLPREDNISOLONE 4 MG PO TBPK: ORAL_TABLET | ORAL | 0 refills | Status: DC

## 2021-07-10 NOTE — ED Provider Notes (Signed)
[facility] EMERGENCY DEPARTMENT Provider Note   CSN: [id number] Arrival date & time: 07/10/21  [id number]     History Chief Complaint  Patient presents with  . Eye Problem    Diana Tran is a 87 y.o. female.  Patient lives at [facility] and presented by EMS. History of cognitive dysfunction. She presented with right eye swelling that has worsened today. Patient is poor historian due to cognitive function so history primarily obtained by daughter. Daughter states that she has had intermittent swelling underneath her right eye. She states that the swelling also sometimes occurs underneath her left eye, however, today her left eye is normal. No pain with eye movements. No history of trauma to the eye. No eye redness or irritation. No vision changes. No headaches or jaw claudication. No congestion. She was seen in the ED about three weeks ago for a similar episode with concern for a facial droop with a negative workup. She was also seen by her PCP for this issue with no diagnosis or resolution. Denies any change in medication.     Eye Problem Associated symptoms: no discharge, no headaches, no itching, no photophobia and no redness       History reviewed. No pertinent past medical history.  There are no problems to display for this patient.   History reviewed. No pertinent surgical history.   OB History   No obstetric history on file.     History reviewed. No pertinent family history.     Home Medications Prior to Admission medications   Medication Sig Start Date End Date Taking? Authorizing Provider  methylPREDNISolone (MEDROL DOSEPAK) 4 MG TBPK tablet Take per pack instructions. 07/10/21  Yes Aleysha Meckler, Adora Fridge, PA-C  amLODipine (NORVASC) 5 MG tablet Take 5 mg by mouth daily. 09/03/20   [provider]  atorvastatin (LIPITOR) 40 MG tablet Take 40 mg by mouth at bedtime.    [provider]  carvedilol (COREG) 6.25 MG tablet Take 6.25  mg by mouth 2 (two) times daily with a meal.    [provider]  LUMIGAN 0.01 % SOLN Place 1 drop into both eyes at bedtime.  07/04/20   [provider]  meclizine (ANTIVERT) 12.5 MG tablet Take 12.5 mg by mouth 2 (two) times daily with a meal.  09/12/20   [provider]  metFORMIN (GLUCOPHAGE) 500 MG tablet Take 500 mg by mouth daily with breakfast.  09/02/20   [provider]  solifenacin (VESICARE) 5 MG tablet Take 5 mg by mouth daily. 09/15/20   [provider]    Allergies    Amlodipine, Galantamine, and Memantine  Review of Systems   Review of Systems  Constitutional:  Negative for chills and fever.  HENT:  Positive for facial swelling. Negative for congestion, rhinorrhea, sinus pressure, sinus pain and sore throat.   Eyes:  Negative for photophobia, pain, discharge, redness, itching and visual disturbance.  Respiratory: Negative.    Cardiovascular: Negative.   Gastrointestinal: Negative.   Genitourinary: Negative.   Neurological:  Negative for facial asymmetry and headaches.  Psychiatric/Behavioral: Negative.     Physical Exam Updated Vital Signs BP (!) 164/78 (BP Location: Right Arm)   Pulse 62   Temp 98.3 F (36.8 C) (Oral)   Resp 16   SpO2 100%   Physical Exam Vitals and nursing note reviewed.  Constitutional:      General: She is not in acute distress.    Appearance: Normal appearance. She is not toxic-appearing.  HENT:     Head: Normocephalic and atraumatic.     Right Ear: Tympanic membrane, ear canal and external ear normal.     Left Ear: Tympanic membrane, ear canal and external ear normal.     Nose: Nose normal. No congestion or rhinorrhea.     Mouth/Throat:     Mouth: Mucous membranes are moist. No oral lesions.     Pharynx: Oropharynx is clear. No oropharyngeal exudate or posterior oropharyngeal erythema.     Comments: No intra-oral edema.  Eyes:     General: Lids are normal. Gaze aligned appropriately. No  scleral icterus.       Right eye: No foreign body, discharge or hordeolum.        Left eye: No foreign body, discharge or hordeolum.     Extraocular Movements: Extraocular movements intact.     Conjunctiva/sclera: Conjunctivae normal.     Right eye: Right conjunctiva is not injected. No chemosis, exudate or hemorrhage.    Left eye: Left conjunctiva is not injected. No chemosis, exudate or hemorrhage.    Pupils: Pupils are equal, round, and reactive to light.     Comments: Swelling noted inferior to right eye. Non tender fluctuant pocket palpated. No overlying erythema/warmth.   Pulmonary:     Effort: Pulmonary effort is normal. No respiratory distress.  Skin:    General: Skin is warm and dry.  Neurological:     General: No focal deficit present.     Mental Status: She is alert. Mental status is at baseline.  Psychiatric:        Mood and Affect: Mood normal.        Behavior: Behavior normal.     Comments: Baseline cognitively impaired      ED Results / Procedures / Treatments   Labs (all labs ordered are listed, but only abnormal results are displayed) Labs Reviewed - No data to display  EKG None  Radiology No results found.  Procedures Procedures   Medications Ordered in ED Medications - No data to display  ED Course  I have reviewed the triage vital signs and the nursing notes.  Pertinent labs & imaging results that were available during my care of the patient were reviewed by me and considered in my medical decision making (see chart for details).    MDM Rules/Calculators/A&P                         Examined eye. Over the past few weeks, she has developed soft tissue edema underneath her right lower eyelid that has been migratory, alternating between her left and right eye lid. Exam and history not consistent with infection. 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 Reviewed medication list. Not on any ACEi or new medications. Unknown etiology. No concern for  airway compromise or oral angioedema. Attending Dr. Johnney Killian- consulted ophthalmology and spoke to Dr. Kathlen Mody- relayed it could be caused by Lumagan solution Dr. Jarrett Ables relays not to stop this due to no issues with the eye itself. Recommend following up with ophthalmologist outpatient. Will dose with solumedrol and send home with medrol dose pack.   Patient care discussed with attending Dr. Johnney Killian who has personally evaluated the patient as shared visit, provided guidance, & is in agreement.   Final Clinical Impression(s) / ED Diagnoses Final diagnoses:  Right Facial edema    Rx / DC Orders ED Discharge Orders          Ordered  methylPREDNISolone (MEDROL DOSEPAK) 4 MG TBPK tablet        07/10/21 1045             Kunaal Walkins, Adora Fridge, PA-C 07/10/21 1052    Charlesetta Shanks, MD 07/26/21 1431

## 2021-07-10 NOTE — ED Triage Notes (Signed)
Pt arrives from [facility] via EMS, EMS called out for fluid pocket under right eye. Pt hx of dementia, at baseline per staff. No other complains. BP 180/60 HR 62 RR 16 O2 100% RA

## 2021-07-10 NOTE — Discharge Instructions (Addendum)
You were seen in the ED for facial swelling. We are sending you home with a steroid dose pack. Follow the instructions per pharmacy. Please call your ophthalmologist to make an appointment as soon as possible.

## 2021-07-10 NOTE — ED Provider Notes (Signed)
I provided a substantive portion of the care of this patient.  I personally performed the entirety of the history for this encounter.     Patient has had migratory soft tissue swelling by her eyes for the past several weeks.  It was at the left upper and lower lid over a week ago and then resolved and moved to the right lower lid area.  No associated drainage or discharge from the eyes.  Patient has puffy edema in the soft tissues beneath the eyes consistent with small focus of angioedema none present around the airway.  Patient is not on any ACE inhibitor.  Unknown etiology for migratory periorbital angioedema mild to moderate in nature.   Consult: Reviewed with Dr. Kathlen Mody.  Advises it could be related to Lumigan but since the eyes are normal continue the Lumigan until seen by ophthalmology.  Agrees with a Medrol Dosepak and follow-up with ophthalmology within a week   Charlesetta Shanks, MD 07/10/21 1030

## 2021-08-12 ENCOUNTER — Emergency Department (HOSPITAL_COMMUNITY)
Admission: EM | Admit: 2021-08-12 | Discharge: 2021-08-12 | Disposition: A | Discharge status: Home / Self Care | Payer: Medicare PPO | Attending: Emergency Medicine | Admitting: Emergency Medicine

## 2021-08-12 ENCOUNTER — Emergency Department (HOSPITAL_COMMUNITY): Payer: Medicare PPO

## 2021-08-12 DIAGNOSIS — Y92129 Unspecified place in nursing home as the place of occurrence of the external cause: Secondary | ICD-10-CM | POA: Insufficient documentation

## 2021-08-12 DIAGNOSIS — G309 Alzheimer's disease, unspecified: Secondary | ICD-10-CM | POA: Diagnosis not present

## 2021-08-12 DIAGNOSIS — F0280 Dementia in other diseases classified elsewhere without behavioral disturbance: Secondary | ICD-10-CM | POA: Diagnosis not present

## 2021-08-12 DIAGNOSIS — M25532 Pain in left wrist: Secondary | ICD-10-CM

## 2021-08-12 DIAGNOSIS — M25531 Pain in right wrist: Secondary | ICD-10-CM | POA: Insufficient documentation

## 2021-08-12 DIAGNOSIS — W500XXA Accidental hit or strike by another person, initial encounter: Secondary | ICD-10-CM | POA: Insufficient documentation
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
## 2021-08-12 NOTE — Progress Notes (Signed)
Orthopedic Tech Progress Note Patient Details:  Diana Tran 08-28-1932 [id number]  Ortho Devices Type of Ortho Device: Thumb velcro splint Ortho Device/Splint Location: L hand velcro thumb spica splint Ortho Device/Splint Interventions: Ordered, Application, Adjustment   Post Interventions Patient Tolerated: Well Instructions Provided:  (no education, pt sleeping and has dementia)  Pt sleeping during application of L wrist/thumb spica pre fab/velcro splint, pt tolerated well, with no signs of pain.  RN signed drop off form as pt has dementia.  Thanks,  Verdene Lennert, PT, DPT  Acute Rehabilitation Ortho Tech Supervisor [phone] pager #[phone] office     Wells Guiles B Gilberto Stanforth 08/12/2021, 10:31 AM

## 2021-08-12 NOTE — ED Notes (Signed)
Ptar called 
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
## 2021-08-12 NOTE — Discharge Instructions (Addendum)
The x-rays today did not show signs of a fracture of the left wrist.  However, it is still possible she has a small fracture that is not visible on films today.  Therefore, I placed Diana Tran in a wrist splint, which I would recommend she wears at all times (except when bathing).  This is designed for comfort and does not need to be wrapped tightly.  She being she can be given Tylenol for pain.  She will need repeat x-rays of her left wrist in 7-10 days by her PCP or an orthopedic doctor if she continues having pain in her wrist.  She can use her left hand for light activities (holding spoon, etc).

## 2021-10-24 ENCOUNTER — Emergency Department (HOSPITAL_COMMUNITY): Payer: Medicare PPO

## 2021-10-24 ENCOUNTER — Emergency Department (HOSPITAL_COMMUNITY)
Admission: EM | Admit: 2021-10-24 | Discharge: 2021-10-24 | Disposition: A | Discharge status: Home / Self Care | Payer: Medicare PPO | Attending: Emergency Medicine | Admitting: Emergency Medicine

## 2021-10-24 ENCOUNTER — Other Ambulatory Visit: Payer: Self-pay

## 2021-10-24 DIAGNOSIS — R040 Epistaxis: Secondary | ICD-10-CM | POA: Insufficient documentation

## 2021-10-24 DIAGNOSIS — W19XXXA Unspecified fall, initial encounter: Secondary | ICD-10-CM | POA: Diagnosis not present

## 2021-10-24 DIAGNOSIS — Z79899 Other long term (current) drug therapy: Secondary | ICD-10-CM | POA: Diagnosis not present

## 2021-10-24 DIAGNOSIS — S0285XA Fracture of orbit, unspecified, initial encounter for closed fracture: Secondary | ICD-10-CM

## 2021-10-24 DIAGNOSIS — I10 Essential (primary) hypertension: Secondary | ICD-10-CM | POA: Insufficient documentation

## 2021-10-24 DIAGNOSIS — J189 Pneumonia, unspecified organism: Secondary | ICD-10-CM

## 2021-10-24 DIAGNOSIS — R911 Solitary pulmonary nodule: Secondary | ICD-10-CM

## 2021-10-24 DIAGNOSIS — S01112A Laceration without foreign body of left eyelid and periocular area, initial encounter: Secondary | ICD-10-CM | POA: Insufficient documentation

## 2021-10-24 DIAGNOSIS — R0789 Other chest pain: Secondary | ICD-10-CM | POA: Diagnosis not present

## 2021-10-24 DIAGNOSIS — F0390 Unspecified dementia without behavioral disturbance: Secondary | ICD-10-CM | POA: Diagnosis not present

## 2021-10-24 DIAGNOSIS — R93429 Abnormal radiologic findings on diagnostic imaging of unspecified kidney: Secondary | ICD-10-CM

## 2021-10-24 LAB — CBC
HCT: 30.8 % — ABNORMAL LOW (ref 36.0–46.0)
Hemoglobin: 9.8 g/dL — ABNORMAL LOW (ref 12.0–15.0)
MCH: 27.4 pg (ref 26.0–34.0)
MCHC: 31.8 g/dL (ref 30.0–36.0)
MCV: 86 fL (ref 80.0–100.0)
Platelets: 295 10*3/uL (ref 150–400)
RBC: 3.58 MIL/uL — ABNORMAL LOW (ref 3.87–5.11)
RDW: 15 % (ref 11.5–15.5)
WBC: 10.4 10*3/uL (ref 4.0–10.5)
nRBC: 0 % (ref 0.0–0.2)

## 2021-10-24 LAB — BASIC METABOLIC PANEL
Anion gap: 8 (ref 5–15)
BUN: 54 mg/dL — ABNORMAL HIGH (ref 8–23)
CO2: 23 mmol/L (ref 22–32)
Calcium: 8.4 mg/dL — ABNORMAL LOW (ref 8.9–10.3)
Chloride: 107 mmol/L (ref 98–111)
Creatinine, Ser: 3.04 mg/dL — ABNORMAL HIGH (ref 0.44–1.00)
GFR, Estimated: 14 mL/min — ABNORMAL LOW (ref >60)
Glucose, Bld: 139 mg/dL — ABNORMAL HIGH (ref 70–99)
Potassium: 4.7 mmol/L (ref 3.5–5.1)
Sodium: 138 mmol/L (ref 135–145)

## 2021-10-24 MED ORDER — SODIUM CHLORIDE 0.9 % IV BOLUS
500.0000 mL | Freq: Once | INTRAVENOUS | Status: AC
  Administered 2021-10-24: 500 mL via INTRAVENOUS

## 2021-10-24 MED ORDER — ACETAMINOPHEN 325 MG PO TABS
650.0000 mg | ORAL_TABLET | Freq: Once | ORAL | Status: AC
  Administered 2021-10-24: 650 mg via ORAL
  Filled 2021-10-24: qty 2

## 2021-10-24 MED ORDER — AZITHROMYCIN 250 MG PO TABS: 250.0000 mg | ORAL_TABLET | Freq: Every day | ORAL | 0 refills | Status: DC

## 2021-10-24 MED ORDER — CARVEDILOL 12.5 MG PO TABS
12.5000 mg | ORAL_TABLET | Freq: Once | ORAL | Status: AC
  Administered 2021-10-24: 12.5 mg via ORAL
  Filled 2021-10-24: qty 1

## 2021-10-24 MED ORDER — HYDRALAZINE HCL 20 MG/ML IJ SOLN
5.0000 mg | Freq: Once | INTRAMUSCULAR | Status: AC
  Administered 2021-10-24: 5 mg via INTRAVENOUS
  Filled 2021-10-24: qty 1

## 2021-10-24 MED ORDER — HYDRALAZINE HCL 20 MG/ML IJ SOLN: 10.0000 mg | Freq: Once | INTRAMUSCULAR | Status: DC

## 2021-10-24 NOTE — ED Provider Notes (Signed)
I provided a substantive portion of the care of this patient.  I personally performed the entirety of the history, exam, and medical decision making for this encounter.   Patient seen by me along with the physician assistant.  Patient is normally at [facility].  Patient had a fall she normally goes with her family on weekends.  There was no loss of conscious patient not on blood thinners.  Patient suffered minor nosebleed and has a 1/4 inch lack on the left eyebrow area.  Patient has dementia mostly nonverbal that is baseline for her and has a history of hypertension.  Did not have her hypertensive meds this morning.  Patient had CT head neck and maxillofacial.  It showed left orbital floor fracture blood in the maxillary sinus.  This was discussed with on-call maxillofacial.  And they will follow that up in the office.  Also had chest x-ray which raise some abnormal concerns for abnormalities in the chest.  This led to CT of chest without contrast due to her renal function.  Patient's GFR's are normally 20s little bit worse here today at 14.  Potassium is normal at 4.7.  Sodium normal.  CT chest raised the following concerns.  She groundglass nodularities in the right middle lobe and to lesser extent right upper lobe.  These nodules are nearly solid largest measuring about 6 mm fine.  Findings may be related to an atypical infection viral or atypical pneumonia is considered.  But she will need follow-up for these nodules with CT scan in 3 to 6 months.  We will start her on Zithromax in case there is a atypical pneumonia ongoing.  Patient really does not have any significant respiratory findings.  Also there is a mass in the upper pole of the right kidney shows a lobular masslike area that will also need follow-up.  This can be done by primary care doctor probably will need ultrasound.  Also patient's blood pressures have remained elevated here despite being given her Norvasc and Coreg.  Patient will need  follow-up for this and trended.  If it remains high like this probably will require admission.  Patient is okay for for discharge home with family member.      Fredia Sorrow, MD 10/24/21 1321

## 2021-10-24 NOTE — Discharge Instructions (Addendum)
Your CT scan of your head, CT scan of your neck were normal and without fracture or other concerns.  CT scan of the face did show fracture of the left eye, as well as some blood in the left maxillary sinus.  We discussed these findings with maxillofacial surgeon who reviewed your CT scan and at this time does not recommend any intervention.  I have attached his information above.  Please give his office a call Monday to schedule your in clinic follow-up appointment.  Your blood pressure was also elevated today you received your home dose of carvedilol.  Your creatinine (kidney function) was also elevated to 3.04 compared to your baseline which is around 2.4.  We believe this is because of dehydration. we gave you fluids in the emergency room.  I do recommend you follow-up with your primary care provider to have your kidney function rechecked, also to follow-up regarding kidney finding on your CT scan.  CT scan of your chest showed potential pneumonia.  I will start you on antibiotics.  CT scan of your chest also showed nodules which will need to be monitored by your primary care provider.  I have listed ENT information above for you.  1 is Dr. Conley Simmonds who is on-call and is aware of your case and has reviewed your CT scans.  He is updated to see you but he is in [place] and you stated you want someone local.  I have also attached contact information for local ENT that you can call to see if you are able to get in.  Since they are not on-call.  They are not complicated seizure.  Your blood pressure was elevated in the emergency room.  You received your home dose of carvedilol.  He also received an additional 5 mg injection of hydralazine.  As discussed elevated blood pressure along is not concerning but if you develop chest pain, shortness of breath, lightheadedness please return to the emergency room.  You can follow-up with your primary care provider regarding this.  Given your age we also do not want  to correct your blood pressure too quickly, because that can result in drop in your blood pressure and lightheadedness causing additional falls.

## 2021-10-24 NOTE — ED Notes (Signed)
Provider made aware of vitals.

## 2021-10-24 NOTE — ED Triage Notes (Signed)
Patient BIBA EMS from [facility] for a witnessed fall. No LOC, not on blood thinners. Patient suffered minor nosebleed that has resolved. 1/4" lac on L eyebrow.  Hx of dementia, mostly non-verbal. Hx HTN.  BP: 238/122 HR: 69 SPO2: 98% on RA

## 2021-10-24 NOTE — ED Notes (Signed)
Provider was made aware of abnormal vitals. Patient discharged.

## 2021-10-24 NOTE — ED Provider Notes (Signed)
[facility] DEPT Provider Note   CSN: [id number] Arrival date & time: 10/24/21  8938     History Chief Complaint  Patient presents with   Fall   Laceration    Diana Tran is a 87 y.o. female.  87 year old female presents today for evaluation following a fall.  Per EMS report this was a witnessed fall by staff and there was no loss of consciousness.  Are unable to clarify if this was mechanical in nature.  She is not on blood thinners.  She did have some epistaxis after the fall which has resolved, along with a laceration lateral to her left eyelid.   Discussion had with patient's nurse at facility who reports patient was being assisted by a care partner when she slipped and fell.  Nurse reports when she entered the room patient had some epistaxis, laceration but otherwise at baseline.  There was no loss of consciousness.  At baseline patient's mental status waxes and wanes given her history of dementia per nurse at facility.  The history is provided by the patient. No language interpreter was used.      No past medical history on file.  There are no problems to display for this patient.   No past surgical history on file.   OB History   No obstetric history on file.     No family history on file.     Home Medications Prior to Admission medications   Medication Sig Start Date End Date Taking? Authorizing Provider  amLODipine (NORVASC) 5 MG tablet Take 5 mg by mouth daily. 09/03/20   [provider]  atorvastatin (LIPITOR) 40 MG tablet Take 40 mg by mouth at bedtime.    [provider]  carvedilol (COREG) 6.25 MG tablet Take 6.25 mg by mouth 2 (two) times daily with a meal.    [provider]  LUMIGAN 0.01 % SOLN Place 1 drop into both eyes at bedtime.  07/04/20   [provider]  meclizine (ANTIVERT) 12.5 MG tablet Take 12.5 mg by mouth 2 (two) times daily with a meal.  09/12/20   [provider]  metFORMIN (GLUCOPHAGE) 500 MG tablet Take 500 mg by mouth daily with breakfast.  09/02/20   [provider]  methylPREDNISolone (MEDROL DOSEPAK) 4 MG TBPK tablet Take per pack instructions. 07/10/21   Loeffler, Adora Fridge, PA-C  solifenacin (VESICARE) 5 MG tablet Take 5 mg by mouth daily. 09/15/20   [provider]    Allergies    Amlodipine, Galantamine, and Memantine  Review of Systems   Review of Systems  Unable to perform ROS: Dementia   Physical Exam Updated Vital Signs BP (!) 241/110   Pulse (!) 57   Temp 98.2 F (36.8 C) (Oral)   Resp 18   Ht 5' (1.524 m)   Wt 52.2 kg   SpO2 100%   BMI 22.46 kg/m   Physical Exam Vitals and nursing note reviewed.  Constitutional:      General: She is not in acute distress.    Appearance: Normal appearance. She is ill-appearing (Chronically ill-appearing).  HENT:     Head: Normocephalic. Laceration (Small laceration to the lateral left eyebrow.  Superficial) present. No raccoon eyes, Battle's sign, right periorbital erythema or left periorbital erythema.     Jaw: No tenderness.     Nose: No nasal deformity or signs of injury.     Right Nostril: Epistaxis present. No foreign body, septal hematoma or occlusion.  Left Nostril: Epistaxis present. No foreign body, septal hematoma or occlusion.     Right Turbinates: Not enlarged.     Left Turbinates: Not enlarged.     Right Sinus: No maxillary sinus tenderness or frontal sinus tenderness.     Left Sinus: No maxillary sinus tenderness or frontal sinus tenderness.     Comments: Dried blood in bilateral nares Eyes:     General: No scleral icterus.    Extraocular Movements: Extraocular movements intact.     Conjunctiva/sclera: Conjunctivae normal.  Cardiovascular:     Rate and Rhythm: Normal rate and regular rhythm.     Pulses: Normal pulses.     Heart sounds: Normal heart sounds.  Pulmonary:     Effort: Pulmonary effort is normal. No respiratory distress.      Breath sounds: Normal breath sounds. No wheezing or rales.  Abdominal:     General: There is no distension.     Tenderness: There is no abdominal tenderness.  Musculoskeletal:        General: Normal range of motion.     Cervical back: Normal range of motion.  Skin:    General: Skin is warm and dry.  Neurological:     General: No focal deficit present.     Mental Status: She is alert. Mental status is at baseline.    ED Results / Procedures / Treatments   Labs (all labs ordered are listed, but only abnormal results are displayed) Labs Reviewed  CBC  BASIC METABOLIC PANEL    EKG None  Radiology CT Head Wo Contrast  Result Date: 10/24/2021 CLINICAL DATA:  87 year old female status post fall. Epistaxis. Left eyebrow laceration. EXAM: CT HEAD WITHOUT CONTRAST TECHNIQUE: Contiguous axial images were obtained from the base of the skull through the vertex without intravenous contrast. COMPARISON:  Head CT 06/13/2021.  Face CT today reported separately. FINDINGS: Brain: Chronic brain volume loss. Chronic confluent vascular calcifications at the basal ganglia. No midline shift, ventriculomegaly, mass effect, evidence of mass lesion, intracranial hemorrhage or evidence of cortically based acute infarction. Patchy and confluent cerebral white matter hypodensity with heterogeneity in the left thalamus appears stable. Vascular: Calcified atherosclerosis at the skull base. No suspicious intracranial vascular hyperdensity. Skull: Minimally displaced fracture posterior left maxillary sinus wall. Evidence of left orbital floor fracture. See face CT. No calvarium fracture identified. Sinuses/Orbits: Hemorrhage layering in the left maxillary sinus. But other Visualized paranasal sinuses and mastoids are stable and well aerated. Other: Mild superficial left periorbital soft tissue swelling. No other orbit or scalp soft tissue injury identified. IMPRESSION: 1. Evidence of left maxilla and orbital floor  fractures with hemorrhage in the left maxillary sinus. See Face CT reported separately. 2. No acute traumatic injury to the brain identified. Chronic cerebral volume loss and small vessel disease. Electronically Signed   By: Genevie Ann M.D.   On: 10/24/2021 08:58   CT Cervical Spine Wo Contrast  Result Date: 10/24/2021 CLINICAL DATA:  87 year old female status post fall. Epistaxis. Left eyebrow laceration. EXAM: CT CERVICAL SPINE WITHOUT CONTRAST TECHNIQUE: Multidetector CT imaging of the cervical spine was performed without intravenous contrast. Multiplanar CT image reconstructions were also generated. COMPARISON:  Head and face CT today. FINDINGS: Alignment: Straightening of cervical lordosis with mild degenerative appearing anterolisthesis of both C3 on C4 and C4 on C5. Facet ankylosis at the former. Bilateral posterior element alignment is within normal limits. Cervicothoracic junction alignment is within normal limits. Skull base and vertebrae: Visualized skull base is intact. No atlanto-occipital  dissociation. C1 and C2 appear intact and aligned. No acute osseous abnormality identified. Soft tissues and spinal canal: No prevertebral fluid or swelling. No visible canal hematoma. Negative for age visible noncontrast deep soft tissue spaces of the neck. Disc levels: C3-C4 facet degeneration and ankylosis. Developing posterior interbody ankylosis also at that level. Advanced disc and endplate degeneration at C5-C6 and C6-C7. But mild if any associated spinal stenosis. Upper chest: Negative. IMPRESSION: 1. No acute traumatic injury identified in the cervical spine. 2. Cervical spine degeneration, including chronic facet degeneration and ankylosis at C3-C4. Electronically Signed   By: Genevie Ann M.D.   On: 10/24/2021 09:07   DG Chest Portable 1 View  Result Date: 10/24/2021 CLINICAL DATA:  87 year old female status post fall. Epistaxis. Left eyebrow laceration. EXAM: PORTABLE CHEST 1 VIEW COMPARISON:  Portable  chest 09/24/2020. FINDINGS: Portable AP semi upright view at 0845 hours. Skin fold artifact along the left chest wall. Lung volumes and mediastinal contours are stable. Mildly tortuous thoracic aorta. Visualized tracheal air column is within normal limits. Vague increased left upper and left lower lung opacity. Allowing for portable technique the right lung appears clear. No pneumothorax or pleural effusion identified. No acute osseous abnormality identified. Negative visible bowel gas. IMPRESSION: Vague and nonspecific increased left upper and left lower lung opacity. Consider aspiration or infection in this setting. PA and lateral views may be helpful when feasible. Electronically Signed   By: Genevie Ann M.D.   On: 10/24/2021 08:55   CT Maxillofacial Wo Contrast  Result Date: 10/24/2021 CLINICAL DATA:  87 year old female status post fall. Epistaxis. Left eyebrow laceration. EXAM: CT MAXILLOFACIAL WITHOUT CONTRAST TECHNIQUE: Multidetector CT imaging of the maxillofacial structures was performed. Multiplanar CT image reconstructions were also generated. COMPARISON:  Head CT today. FINDINGS: Osseous: Absent dentition. Mandible intact and normally located. No zygoma fracture. No nasal bone fracture. No pterygoid fracture. Right maxilla appears intact. Comminuted but minimally displaced fracture along the posterior and inferior walls of the left maxillary sinus. Trace deep soft tissue gas along the anterior maxilla. Underlying osteopenia. Central skull base appears intact. Cervical spine is reported separately. Orbits: Right orbit walls are intact. Comminuted but minimally displaced fracture of the left orbital floor along the course of the infraorbital nerve. Small volume extraconal gas within the left inferior orbit. Other left orbital walls appear intact. Globes and other intraorbital soft tissues are within normal limits. Sinuses: Hemorrhage layering in the left maxillary sinus. Other paranasal sinuses and  mastoids are clear. Negative nasal cavity. Soft tissues: Mild left periorbital soft tissue swelling and stranding. Trace posttraumatic gas tracking along the anterior left maxillary wall. Otherwise negative visible noncontrast deep soft tissue spaces of the face. Limited intracranial: Reported separately. IMPRESSION: 1. Comminuted but minimally displaced fractures of the left orbital floor and left maxillary sinus. Trace gas within the left orbit. No herniated orbital contents or intraorbital hematoma. Hemorrhage layering in the left maxillary sinus and trace adjacent soft tissue gas. Mild superficial left periorbital hematoma. 2. Head and Cervical spine CT reported separately. Electronically Signed   By: Genevie Ann M.D.   On: 10/24/2021 09:03    Procedures Procedures   Medications Ordered in ED Medications  acetaminophen (TYLENOL) tablet 650 mg (650 mg Oral Given 10/24/21 6063)    ED Course  I have reviewed the triage vital signs and the nursing notes.  Pertinent labs & imaging results that were available during my care of the patient were reviewed by me and considered in my medical  decision making (see chart for details).    MDM Rules/Calculators/A&P                           87 year old female presents today for evaluation following a fall which was witnessed and mechanical.  Patient following a fall had epistaxis, minor laceration to the lateral left eyebrow.  She is not on blood thinners and she is without loss of consciousness.  CT head and neck without acute findings.  Chest x-ray with left lung opacity we will follow-up with CT scan to evaluate for pneumonia.  She is without clinical symptoms of pneumonia.  CBC without leukocytosis.  Anemia is at her baseline.  BMP significant for creatinine of 3.04 which is up from her baseline which appears to be around mid 2s.  Recently 2.34 months ago and 2.43 before that.  Her BUN is also elevated into the 50s.  She is likely dehydrated.  We will give  patient 500 mL fluid bolus.  CT maxillofacial is significant for left posterior inferior minimally displaced fracture, along with soft tissue gas in the anterior maxilla, extraconal gas in the left inferior orbit, and presence of hemorrhage in the left maxillary sinus.  Findings discussed with on-call maxillofacial surgeon Dr. Conley Simmonds who reviewed the CT scan and does not recommend acute intervention, but does state patient needs sinus precautions and follow-up in clinic with him.  These precautions were discussed with daughter and patient.  Daughter was at bedside at this time.  Patient's BP also elevated.  She did not take her home medications.  We will give her home Coreg dose of 12.5.  CT chest with subcentimeter pulmonary nodules.  Potential atypical pneumonia.  Will treat with Zithromax.  CT chest also with renal findings concerning for mass.  She will follow-up with her PCP regarding further work-up and management of this.  Patient given 5 mg IV hydralazine injection prior to discharge for elevated blood pressure.  However given her age without visual change, chest pain, shortness of breath with caution against rapid correction.  Patient is appropriate for discharge.   Final Clinical Impression(s) / ED Diagnoses Final diagnoses:  None    Rx / DC Orders ED Discharge Orders     None        Evlyn Courier, PA-C 10/24/21 1429    Fredia Sorrow, MD 10/29/21 [phone]

## 2021-10-25 ENCOUNTER — Emergency Department (HOSPITAL_COMMUNITY): Payer: Medicare PPO

## 2021-10-25 ENCOUNTER — Emergency Department (HOSPITAL_COMMUNITY)
Admission: EM | Admit: 2021-10-25 | Discharge: 2021-10-25 | Disposition: A | Discharge status: Home / Self Care | Payer: Medicare PPO | Attending: Emergency Medicine | Admitting: Emergency Medicine

## 2021-10-25 ENCOUNTER — Encounter (HOSPITAL_COMMUNITY): Payer: Self-pay | Admitting: Emergency Medicine

## 2021-10-25 ENCOUNTER — Other Ambulatory Visit: Payer: Self-pay

## 2021-10-25 DIAGNOSIS — S0083XA Contusion of other part of head, initial encounter: Secondary | ICD-10-CM | POA: Diagnosis not present

## 2021-10-25 DIAGNOSIS — S0012XA Contusion of left eyelid and periocular area, initial encounter: Secondary | ICD-10-CM | POA: Insufficient documentation

## 2021-10-25 DIAGNOSIS — I69393 Ataxia following cerebral infarction: Secondary | ICD-10-CM | POA: Diagnosis not present

## 2021-10-25 DIAGNOSIS — T148XXA Other injury of unspecified body region, initial encounter: Secondary | ICD-10-CM

## 2021-10-25 DIAGNOSIS — W19XXXA Unspecified fall, initial encounter: Secondary | ICD-10-CM | POA: Diagnosis not present

## 2021-10-25 DIAGNOSIS — S0990XA Unspecified injury of head, initial encounter: Secondary | ICD-10-CM | POA: Diagnosis present

## 2021-10-25 DIAGNOSIS — R270 Ataxia, unspecified: Secondary | ICD-10-CM

## 2021-10-25 MED ORDER — LORAZEPAM 2 MG/ML IJ SOLN
0.5000 mg | Freq: Once | INTRAMUSCULAR | Status: AC
  Administered 2021-10-25: 18:00:00 0.5 mg via INTRAVENOUS
  Filled 2021-10-25: qty 1

## 2021-10-25 NOTE — ED Notes (Signed)
Patient transported to MRI 

## 2021-10-25 NOTE — ED Provider Notes (Signed)
[place] DEPT Provider Note   CSN: [id number] Arrival date & time: 10/25/21  1532     History Chief Complaint  Patient presents with   Eye Problem    Diana Tran is a 87 y.o. female.  HPI    87 year old female brought into the ER with chief complaint of rash around the eye and also worsening balance issues.  Patient had a mechanical fall yesterday.  She was seen in the ER and diagnosed with orbital fracture.  Daughter reports that since the fall patient has been having worsening gait and also she is favoring/leaning on her left side even when she is sitting up.  Patient is not on any blood thinners.  History reviewed. No pertinent past medical history.  There are no problems to display for this patient.   History reviewed. No pertinent surgical history.   OB History   No obstetric history on file.     History reviewed. No pertinent family history.  Social History   Tobacco Use   Smoking status: Never   Smokeless tobacco: Never    Home Medications Prior to Admission medications   Medication Sig Start Date End Date Taking? Authorizing Provider  amLODipine (NORVASC) 5 MG tablet Take 5 mg by mouth daily. 09/03/20   [provider]  atorvastatin (LIPITOR) 40 MG tablet Take 40 mg by mouth at bedtime.    [provider]  azithromycin (ZITHROMAX Z-PAK) 250 MG tablet Take 1 tablet (250 mg total) by mouth daily. Take 2 tablets on the first day, and 1 tablet daily thereafter until complete. 10/24/21   Deatra Canter, Amjad, PA-C  carvedilol (COREG) 6.25 MG tablet Take 6.25 mg by mouth 2 (two) times daily with a meal.    [provider]  LUMIGAN 0.01 % SOLN Place 1 drop into both eyes at bedtime.  07/04/20   [provider]  meclizine (ANTIVERT) 12.5 MG tablet Take 12.5 mg by mouth 2 (two) times daily with a meal.  09/12/20   [provider]  metFORMIN (GLUCOPHAGE) 500 MG tablet Take 500 mg by mouth daily  with breakfast.  09/02/20   [provider]  methylPREDNISolone (MEDROL DOSEPAK) 4 MG TBPK tablet Take per pack instructions. 07/10/21   Loeffler, Adora Fridge, PA-C  solifenacin (VESICARE) 5 MG tablet Take 5 mg by mouth daily. 09/15/20   [provider]    Allergies    Amlodipine, Galantamine, and Memantine  Review of Systems   Review of Systems  Unable to perform ROS: Dementia  Constitutional:  Positive for activity change.  Skin:  Positive for wound.  Hematological:  Does not bruise/bleed easily.   Physical Exam Updated Vital Signs BP (!) 153/135 (BP Location: Left Arm) Comment: Pt is restless and moving around.  Pulse 73   Temp 98.2 F (36.8 C)   Resp 18   Ht 5' (1.524 m)   Wt 52 kg   SpO2 99%   BMI 22.39 kg/m   Physical Exam Vitals and nursing note reviewed.  Constitutional:      Appearance: She is well-developed.  HENT:     Head: Atraumatic.  Eyes:     Extraocular Movements: Extraocular movements intact.     Conjunctiva/sclera: Conjunctivae normal.  Cardiovascular:     Rate and Rhythm: Normal rate.  Pulmonary:     Effort: Pulmonary effort is normal.  Musculoskeletal:     Cervical back: Normal range of motion and neck supple.  Skin:    General: Skin is  warm and dry.     Findings: Bruising present.     Comments: Ecchymosis in the left periorbital region and left maxillary region  Neurological:     Mental Status: She is alert.     Cranial Nerves: No cranial nerve deficit.     Sensory: No sensory deficit.     Gait: Gait abnormal.    ED Results / Procedures / Treatments   Labs (all labs ordered are listed, but only abnormal results are displayed) Labs Reviewed - No data to display  EKG None  Radiology CT HEAD WO CONTRAST (5MM)  Result Date: 10/25/2021 CLINICAL DATA:  Fall yesterday, worsening swelling of the left eye EXAM: CT HEAD WITHOUT CONTRAST CT MAXILLOFACIAL WITHOUT CONTRAST TECHNIQUE: Multidetector CT imaging of the head and  maxillofacial structures were performed using the standard protocol without intravenous contrast. Multiplanar CT image reconstructions of the maxillofacial structures were also generated. COMPARISON:  10/24/2021 FINDINGS: CT HEAD FINDINGS Brain: No evidence of acute infarction, hemorrhage, hydrocephalus, extra-axial collection or mass lesion/mass effect. Periventricular and deep white matter hypodensity. Vascular: No hyperdense vessel or unexpected calcification. Skull: Normal. Negative for fracture or focal lesion. Other: None. CT MAXILLOFACIAL FINDINGS Osseous: No new fracture or mandibular dislocation. Unchanged, minimally depressed left orbital floor and anterior and lateral left maxillary sinus wall fractures, as seen on prior day examination (series 4, image 46, series 9, image 33). No destructive process. Orbits: Orbital contents are intact. Sinuses: Redemonstrated high attenuation air-fluid level in the left maxillary sinus. Soft tissues: Soft tissue contusion of the left forehead and left orbit. IMPRESSION: 1. No acute intracranial pathology. Small-vessel white matter disease. 2. Unchanged, minimally depressed left orbital floor and anterior and lateral left maxillary sinus wall fractures, as seen on prior day examination. 3. No new fracture. 4. Unchanged blood product in the left maxillary sinus. 5. Soft tissue contusion of the left forehead and left orbit. Electronically Signed   By: Delanna Ahmadi M.D.   On: 10/25/2021 17:01   CT Head Wo Contrast  Result Date: 10/24/2021 CLINICAL DATA:  87 year old female status post fall. Epistaxis. Left eyebrow laceration. EXAM: CT HEAD WITHOUT CONTRAST TECHNIQUE: Contiguous axial images were obtained from the base of the skull through the vertex without intravenous contrast. COMPARISON:  Head CT 06/13/2021.  Face CT today reported separately. FINDINGS: Brain: Chronic brain volume loss. Chronic confluent vascular calcifications at the basal ganglia. No midline  shift, ventriculomegaly, mass effect, evidence of mass lesion, intracranial hemorrhage or evidence of cortically based acute infarction. Patchy and confluent cerebral white matter hypodensity with heterogeneity in the left thalamus appears stable. Vascular: Calcified atherosclerosis at the skull base. No suspicious intracranial vascular hyperdensity. Skull: Minimally displaced fracture posterior left maxillary sinus wall. Evidence of left orbital floor fracture. See face CT. No calvarium fracture identified. Sinuses/Orbits: Hemorrhage layering in the left maxillary sinus. But other Visualized paranasal sinuses and mastoids are stable and well aerated. Other: Mild superficial left periorbital soft tissue swelling. No other orbit or scalp soft tissue injury identified. IMPRESSION: 1. Evidence of left maxilla and orbital floor fractures with hemorrhage in the left maxillary sinus. See Face CT reported separately. 2. No acute traumatic injury to the brain identified. Chronic cerebral volume loss and small vessel disease. Electronically Signed   By: Genevie Ann M.D.   On: 10/24/2021 08:58   CT Chest Wo Contrast  Result Date: 10/24/2021 CLINICAL DATA:  An 87 year old female presents for evaluation of interstitial infiltrates on chest x-ray. EXAM: CT CHEST WITHOUT CONTRAST TECHNIQUE:  Multidetector CT imaging of the chest was performed following the standard protocol without IV contrast. COMPARISON:  Chest x-ray which was acquired on October 24, 2021. FINDINGS: Cardiovascular: Calcific atherosclerotic changes of the thoracic aorta without aneurysmal dilation. Mildly tortuous thoracic aorta. Normal caliber of central pulmonary vessels. Normal heart size without substantial pericardial effusion. Three-vessel coronary artery calcification. Mediastinum/Nodes: Scattered un enlarged lymph nodes throughout the chest. Esophagus grossly unremarkable by CT. Lungs/Pleura: No effusion. No lobar level consolidation. Patchy ground-glass  nodularity in the RIGHT middle lobe (image 71/7 through image 85/7. Some of these nodular areas are nearly solid, the largest area measuring 6 mm. Discrete ground-glass nodule in the RIGHT upper lobe (image 45/7) approximately 6 mm. Mild interstitial prominence at the lung apices with septal thickening. Airways are patent. Mild basilar atelectasis. Upper Abdomen: Incidental imaging of upper abdominal contents shows lobular masslike area extending from the upper pole of the RIGHT kidney, this measures low-density but has very lobular margins measuring up to 4.3 x 4.6 cm with a density value of 20 Hounsfield units. Cystic lesions are likely present on the LEFT. Imaged portions the liver, spleen, pancreas and adrenal glands are unremarkable. Musculoskeletal: No acute bone finding. No destructive bone process. Spinal degenerative changes. IMPRESSION: Patchy ground-glass nodularity in the RIGHT middle lobe and to a lesser extent RIGHT upper lobe. Some of these nodular areas are nearly solid, the largest area measuring 6 mm. Findings may be related to atypical infection, viral or atypical pneumonia is considered. Smooth septal thickening could also be seen in the setting of pulmonary edema. Given the presence of a discrete 6 mm pulmonary nodule and other areas of ground-glass would suggest the following. Non-contrast chest CT at 3-6 months is recommended. If nodules persist, subsequent management will be based upon the most suspicious nodule(s). This recommendation follows the consensus statement: Guidelines for Management of Incidental Pulmonary Nodules Detected on CT Images: From the Fleischner Society 2017; Radiology 2017; 284:228-243. Incidental imaging of the upper pole of the RIGHT kidney shows lobular masslike area extending from the upper pole of the RIGHT kidney, this measures up to 4.3 x 4.6 cm with a density value of 20 Hounsfield units. Despite low-density a renal mass is not excluded based on the contours.  Initial assessment could be performed with renal sonogram to exclude renal mass. Three-vessel coronary artery calcification. Aortic atherosclerosis. Electronically Signed   By: Zetta Bills M.D.   On: 10/24/2021 10:50   CT Cervical Spine Wo Contrast  Result Date: 10/24/2021 CLINICAL DATA:  87 year old female status post fall. Epistaxis. Left eyebrow laceration. EXAM: CT CERVICAL SPINE WITHOUT CONTRAST TECHNIQUE: Multidetector CT imaging of the cervical spine was performed without intravenous contrast. Multiplanar CT image reconstructions were also generated. COMPARISON:  Head and face CT today. FINDINGS: Alignment: Straightening of cervical lordosis with mild degenerative appearing anterolisthesis of both C3 on C4 and C4 on C5. Facet ankylosis at the former. Bilateral posterior element alignment is within normal limits. Cervicothoracic junction alignment is within normal limits. Skull base and vertebrae: Visualized skull base is intact. No atlanto-occipital dissociation. C1 and C2 appear intact and aligned. No acute osseous abnormality identified. Soft tissues and spinal canal: No prevertebral fluid or swelling. No visible canal hematoma. Negative for age visible noncontrast deep soft tissue spaces of the neck. Disc levels: C3-C4 facet degeneration and ankylosis. Developing posterior interbody ankylosis also at that level. Advanced disc and endplate degeneration at C5-C6 and C6-C7. But mild if any associated spinal stenosis. Upper chest: Negative. IMPRESSION: 1.  No acute traumatic injury identified in the cervical spine. 2. Cervical spine degeneration, including chronic facet degeneration and ankylosis at C3-C4. Electronically Signed   By: Genevie Ann M.D.   On: 10/24/2021 09:07   DG Chest Portable 1 View  Result Date: 10/24/2021 CLINICAL DATA:  87 year old female status post fall. Epistaxis. Left eyebrow laceration. EXAM: PORTABLE CHEST 1 VIEW COMPARISON:  Portable chest 09/24/2020. FINDINGS: Portable AP semi  upright view at 0845 hours. Skin fold artifact along the left chest wall. Lung volumes and mediastinal contours are stable. Mildly tortuous thoracic aorta. Visualized tracheal air column is within normal limits. Vague increased left upper and left lower lung opacity. Allowing for portable technique the right lung appears clear. No pneumothorax or pleural effusion identified. No acute osseous abnormality identified. Negative visible bowel gas. IMPRESSION: Vague and nonspecific increased left upper and left lower lung opacity. Consider aspiration or infection in this setting. PA and lateral views may be helpful when feasible. Electronically Signed   By: Genevie Ann M.D.   On: 10/24/2021 08:55   CT Maxillofacial Wo Contrast  Result Date: 10/25/2021 CLINICAL DATA:  Fall yesterday, worsening swelling of the left eye EXAM: CT HEAD WITHOUT CONTRAST CT MAXILLOFACIAL WITHOUT CONTRAST TECHNIQUE: Multidetector CT imaging of the head and maxillofacial structures were performed using the standard protocol without intravenous contrast. Multiplanar CT image reconstructions of the maxillofacial structures were also generated. COMPARISON:  10/24/2021 FINDINGS: CT HEAD FINDINGS Brain: No evidence of acute infarction, hemorrhage, hydrocephalus, extra-axial collection or mass lesion/mass effect. Periventricular and deep white matter hypodensity. Vascular: No hyperdense vessel or unexpected calcification. Skull: Normal. Negative for fracture or focal lesion. Other: None. CT MAXILLOFACIAL FINDINGS Osseous: No new fracture or mandibular dislocation. Unchanged, minimally depressed left orbital floor and anterior and lateral left maxillary sinus wall fractures, as seen on prior day examination (series 4, image 46, series 9, image 33). No destructive process. Orbits: Orbital contents are intact. Sinuses: Redemonstrated high attenuation air-fluid level in the left maxillary sinus. Soft tissues: Soft tissue contusion of the left forehead and  left orbit. IMPRESSION: 1. No acute intracranial pathology. Small-vessel white matter disease. 2. Unchanged, minimally depressed left orbital floor and anterior and lateral left maxillary sinus wall fractures, as seen on prior day examination. 3. No new fracture. 4. Unchanged blood product in the left maxillary sinus. 5. Soft tissue contusion of the left forehead and left orbit. Electronically Signed   By: Delanna Ahmadi M.D.   On: 10/25/2021 17:01   CT Maxillofacial Wo Contrast  Result Date: 10/24/2021 CLINICAL DATA:  87 year old female status post fall. Epistaxis. Left eyebrow laceration. EXAM: CT MAXILLOFACIAL WITHOUT CONTRAST TECHNIQUE: Multidetector CT imaging of the maxillofacial structures was performed. Multiplanar CT image reconstructions were also generated. COMPARISON:  Head CT today. FINDINGS: Osseous: Absent dentition. Mandible intact and normally located. No zygoma fracture. No nasal bone fracture. No pterygoid fracture. Right maxilla appears intact. Comminuted but minimally displaced fracture along the posterior and inferior walls of the left maxillary sinus. Trace deep soft tissue gas along the anterior maxilla. Underlying osteopenia. Central skull base appears intact. Cervical spine is reported separately. Orbits: Right orbit walls are intact. Comminuted but minimally displaced fracture of the left orbital floor along the course of the infraorbital nerve. Small volume extraconal gas within the left inferior orbit. Other left orbital walls appear intact. Globes and other intraorbital soft tissues are within normal limits. Sinuses: Hemorrhage layering in the left maxillary sinus. Other paranasal sinuses and mastoids are clear. Negative nasal cavity.  Soft tissues: Mild left periorbital soft tissue swelling and stranding. Trace posttraumatic gas tracking along the anterior left maxillary wall. Otherwise negative visible noncontrast deep soft tissue spaces of the face. Limited intracranial: Reported  separately. IMPRESSION: 1. Comminuted but minimally displaced fractures of the left orbital floor and left maxillary sinus. Trace gas within the left orbit. No herniated orbital contents or intraorbital hematoma. Hemorrhage layering in the left maxillary sinus and trace adjacent soft tissue gas. Mild superficial left periorbital hematoma. 2. Head and Cervical spine CT reported separately. Electronically Signed   By: Genevie Ann M.D.   On: 10/24/2021 09:03    Procedures Procedures   Medications Ordered in ED Medications  LORazepam (ATIVAN) injection 0.5 mg (0.5 mg Intravenous Given 10/25/21 1801)    ED Course  I have reviewed the triage vital signs and the nursing notes.  Pertinent labs & imaging results that were available during my care of the patient were reviewed by me and considered in my medical decision making (see chart for details).  Clinical Course as of 10/25/21 1914  Nancy Fetter Oct 25, 2021  1913 We were unsuccessful in getting patient to cooperate for MRI, therefore, family is okay with the plan be of having outpatient neurology follow-up and further diagnostic management by them. [AN]    Clinical Course User Index [AN] Varney Biles, MD   MDM Rules/Calculators/A&P                           Patient had come in with chief complaint of worsening bruising and some balance issues.  In the triage, she had CT scan of her head and C-spine ordered.  It reveals hematoma which we suspected.  No delayed subdural hematoma appreciated.  There is also ataxic gait, the daughter reports is new.  Patient was also allegedly leaning more towards left side.  On my exam, patient does have ataxic gait.  Patient has dementia, no neurologist, and getting her to and from appointments has become more challenging for the patient who is also managing her father.  We discussed conservative approach of outpatient neurology follow-up and PT at her memory unit and subsequent work-up -but at this time, the  daughter prefers to be can get an MRI done here to ensure there is no stroke, that way her visit to the neurologist could be also more meaningful in future.  MRI has been ordered. Anticipate discharge.  Final Clinical Impression(s) / ED Diagnoses Final diagnoses:  Ataxia after head trauma  Hematoma    Rx / DC Orders ED Discharge Orders          Ordered    Ambulatory referral to Neurology       Comments: An appointment is requested in approximately: 1 week   10/25/21 1912             Varney Biles, MD 10/25/21 1914

## 2021-10-25 NOTE — ED Provider Notes (Signed)
Emergency Medicine Provider Triage Evaluation Note  Diana Tran , a 87 y.o. female  was evaluated in triage.  Pt complains of yesterday at nursing facility, history of dementia, nonverbal.  Had some facial fractures.  Daughter states today patient had worsening swelling and pain. Unsure if she has blown her nose. No additional falls  Review of Systems  Positive: Facial injury Negative:   Physical Exam  There were no vitals taken for this visit. Gen:   Awake, no distress   Face:  Left eye ecchymosis, facial swelling.  Unable to visualize left eye due to patient cooperation Resp:  Normal effort  MSK:   Moves extremities without difficulty  Other:    Medical Decision Making  Medically screening exam initiated at 3:42 PM.  Appropriate orders placed.  Diana Tran was informed that the remainder of the evaluation will be completed by another provider, this initial triage assessment does not replace that evaluation, and the importance of remaining in the ED until their evaluation is complete.  Recheck facial injury, worsening swelling   Diana Tran A, PA-C 10/25/21 [place], Ankit, MD 10/25/21 1754

## 2021-10-25 NOTE — Discharge Instructions (Addendum)
Ms. Speelman was seen for unsteady gait and worsening bruising.  The CT scans are unchanged from yesterday besides seeing the blood product pulled up around the eye.  We anticipate that the bruising around the eye will start changing colors and gradually improve.  We wanted to get an MRI today, but were unsuccessful.  We have given referral to you for neurology.  Please contact the neurology clinic tomorrow to set up an appointment as soon as possible.

## 2021-10-25 NOTE — ED Triage Notes (Signed)
PT seen yesterday after fall. BIB daughter today with reported worsening swelling to eye with new lean to left side. Dementia per daughter.

## 2021-10-28 ENCOUNTER — Ambulatory Visit (INDEPENDENT_AMBULATORY_CARE_PROVIDER_SITE_OTHER): Payer: Medicare PPO | Admitting: Physician Assistant

## 2021-10-28 ENCOUNTER — Other Ambulatory Visit: Payer: Self-pay

## 2021-10-28 ENCOUNTER — Encounter: Payer: Self-pay | Admitting: Physician Assistant

## 2021-10-28 VITALS — HR 72 | Resp 20 | Ht 60.0 in | Wt 116.0 lb

## 2021-10-28 DIAGNOSIS — R482 Apraxia: Secondary | ICD-10-CM

## 2021-10-28 DIAGNOSIS — G301 Alzheimer's disease with late onset: Secondary | ICD-10-CM | POA: Diagnosis not present

## 2021-10-28 DIAGNOSIS — F0280 Dementia in other diseases classified elsewhere without behavioral disturbance: Secondary | ICD-10-CM | POA: Diagnosis not present

## 2021-10-28 NOTE — Patient Instructions (Addendum)
It was a pleasure to see you today at our office.   Recommendations:  Meds: Follow up  as needed Recommend 24/7 care at a [facility]: 1. Continue to exercise (Recommend 30 minutes of walking everyday, or 3 hours every week) 2. Increase social interactions - continue going to [place] and enjoy social gatherings with friends and family 3. Eat healthy, avoid fried foods and eat more fruits and vegetables 4. Maintain adequate blood pressure, blood sugar, and blood cholesterol level. Reducing the risk of stroke and cardiovascular disease also helps promoting better memory. 5. Avoid stressful situations. Live a simple life and avoid aggravations. Organize your time and prepare for the next day in anticipation. 6. Sleep well, avoid any interruptions of sleep and avoid any distractions in the bedroom that may interfere with adequate sleep quality 7. Avoid sugar, avoid sweets as there is a strong link between excessive sugar intake, diabetes, and cognitive impairment We discussed the Mediterranean diet, which has been shown to help patients reduce the risk of progressive memory disorders and reduces cardiovascular risk. This includes eating fish, eat fruits and green leafy vegetables, nuts like almonds and hazelnuts, walnuts, and also use olive oil. Avoid fast foods and fried foods as much as possible. Avoid sweets and sugar as sugar use has been linked to worsening of memory function.  There is always a concern of gradual progression of memory problems. If this is the case, then we may need to adjust level of care according to patient needs. Support, both to the patient and caregiver, should then be put into place.    The Alzheimer's Association is here all day, every day for people facing Alzheimer's disease through our free 24/7 Helpline: [phone]. The Helpline provides reliable information and support to all those who need  assistance, such as individuals living with memory loss, Alzheimer's or other dementia, caregivers, health care professionals and the public.  Our highly trained and knowledgeable staff can help you with: Understanding memory loss, dementia and Alzheimer's  Medications and other treatment options  General information about aging and brain health  Skills to provide quality care and to find the best care from professionals  Legal, financial and living-arrangement decisions Our Helpline also features: Confidential care consultation provided by master's level clinicians who can help with decision-making support, crisis assistance and education on issues families face every day  Help in a caller's preferred language using our translation service that features more than 200 languages and dialects  Referrals to local community programs, services and ongoing support     FALL PRECAUTIONS: Be cautious when walking. Scan the area for obstacles that may increase the risk of trips and falls. When getting up in the mornings, sit up at the edge of the bed for a few minutes before getting out of bed. Consider elevating the bed at the head end to avoid drop of blood pressure when getting up. Walk always in a well-lit room (use night lights in the walls). Avoid area rugs or power cords from appliances in the middle of the walkways. Use a walker or a cane if necessary and consider physical therapy for balance exercise. Get your eyesight checked regularly.  FINANCIAL OVERSIGHT: Supervision, especially oversight when making financial decisions or transactions is also recommended.  HOME SAFETY: Consider the safety of the kitchen when operating appliances like stoves, microwave oven, and blender. Consider having supervision and share cooking responsibilities until no longer able to participate  in those. Accidents with firearms and other hazards in the house should be identified and addressed as well.   ABILITY TO BE  LEFT ALONE: If patient is unable to contact 911 operator, consider using LifeLine, or when the need is there, arrange for someone to stay with patients. Smoking is a fire hazard, consider supervision or cessation. Risk of wandering should be assessed by caregiver and if detected at any point, supervision and safe proof recommendations should be instituted.  MEDICATION SUPERVISION: Inability to self-administer medication needs to be constantly addressed. Implement a mechanism to ensure safe administration of the medications.   DRIVING: Regarding driving, in patients with progressive memory problems, driving will be impaired. We advise to have someone else do the driving if trouble finding directions or if minor accidents are reported. Independent driving assessment is available to determine safety of driving.   If you are interested in the driving assessment, you can contact the following:  [employer] in [place]  [address] [place] [phone] or [phone]      [place] refers to food and lifestyle choices that are based on the traditions of countries located on the [employer]. This way of eating has been shown to help prevent certain conditions and improve outcomes for people who have chronic diseases, like kidney disease and heart disease. What are tips for following this plan? Lifestyle  Cook and eat meals together with your family, when possible. Drink enough fluid to keep your urine clear or pale yellow. Be physically active every day. This includes: Aerobic exercise like running or swimming. Leisure activities like gardening, walking, or housework. Get 7-8 hours of sleep each night. If recommended by your health care provider, drink red wine in moderation. This means 1 glass a day for nonpregnant women and 2 glasses a day for men. A  glass of wine equals 5 oz (150 mL). Reading food labels  Check the serving size of packaged foods. For foods such as rice and pasta, the serving size refers to the amount of cooked product, not dry. Check the total fat in packaged foods. Avoid foods that have saturated fat or trans fats. Check the ingredients list for added sugars, such as corn syrup. Shopping  At the grocery store, buy most of your food from the areas near the walls of the store. This includes: Fresh fruits and vegetables (produce). Grains, beans, nuts, and seeds. Some of these may be available in unpackaged forms or large amounts (in bulk). Fresh seafood. Poultry and eggs. Low-fat dairy products. Buy whole ingredients instead of prepackaged foods. Buy fresh fruits and vegetables in-season from local farmers markets. Buy frozen fruits and vegetables in resealable bags. If you do not have access to quality fresh seafood, buy precooked frozen shrimp or canned fish, such as tuna, salmon, or sardines. Buy small amounts of raw or cooked vegetables, salads, or olives from the deli or salad bar at your store. Stock your pantry so you always have certain foods on hand, such as olive oil, canned tuna, canned tomatoes, rice, pasta, and beans. Cooking  Cook foods with extra-virgin olive oil instead of using butter or other vegetable oils. Have meat as a side dish, and have vegetables or grains as your main dish. This means having meat in small portions or adding small amounts of meat to foods like pasta or stew. Use beans or vegetables instead of meat in common dishes like  chili or lasagna. Experiment with different cooking methods. Try roasting or broiling vegetables instead of steaming or sauteing them. Add frozen vegetables to soups, stews, pasta, or rice. Add nuts or seeds for added healthy fat at each meal. You can add these to yogurt, salads, or vegetable dishes. Marinate fish or vegetables using olive oil, lemon juice, garlic,  and fresh herbs. Meal planning  Plan to eat 1 vegetarian meal one day each week. Try to work up to 2 vegetarian meals, if possible. Eat seafood 2 or more times a week. Have healthy snacks readily available, such as: Vegetable sticks with hummus. Greek yogurt. Fruit and nut trail mix. Eat balanced meals throughout the week. This includes: Fruit: 2-3 servings a day Vegetables: 4-5 servings a day Low-fat dairy: 2 servings a day Fish, poultry, or lean meat: 1 serving a day Beans and legumes: 2 or more servings a week Nuts and seeds: 1-2 servings a day Whole grains: 6-8 servings a day Extra-virgin olive oil: 3-4 servings a day Limit red meat and sweets to only a few servings a month What are my food choices? Mediterranean diet Recommended Grains: Whole-grain pasta. Brown rice. Bulgar wheat. Polenta. Couscous. Whole-wheat bread. [place]. Vegetables: Artichokes. Beets. Broccoli. Cabbage. Carrots. Eggplant. Green beans. Chard. Kale. Spinach. Onions. Leeks. Peas. Squash. Tomatoes. Peppers. Radishes. Fruits: Apples. Apricots. Avocado. Berries. Bananas. Cherries. Dates. Figs. Grapes. Lemons. Melon. Oranges. Peaches. Plums. Pomegranate. Meats and other protein foods: Beans. Almonds. Sunflower seeds. Pine nuts. Peanuts. [place]. Salmon. Scallops. Shrimp. [place]. Tilapia. Clams. Oysters. Eggs. Dairy: Low-fat milk. Cheese. Greek yogurt. Beverages: Water. Red wine. Herbal tea. Fats and oils: Extra virgin olive oil. Avocado oil. Grape seed oil. Sweets and desserts: [place] yogurt with honey. Baked apples. Poached pears. Trail mix. Seasoning and other foods: Basil. Cilantro. Coriander. Cumin. Mint. Parsley. Sage. Rosemary. Tarragon. Garlic. Oregano. Thyme. Pepper. Balsalmic vinegar. Tahini. Hummus. Tomato sauce. Olives. Mushrooms. Limit these Grains: Prepackaged pasta or rice dishes. Prepackaged cereal with added sugar. Vegetables: Deep fried potatoes (french fries). Fruits: Fruit canned in  syrup. Meats and other protein foods: Beef. Pork. Lamb. Poultry with skin. Hot dogs. [place] Salines. Dairy: Ice cream. Sour cream. Whole milk. Beverages: Juice. Sugar-sweetened soft drinks. Beer. Liquor and spirits. Fats and oils: Butter. Canola oil. Vegetable oil. Beef fat (tallow). Lard. Sweets and desserts: Cookies. Cakes. Pies. Candy. Seasoning and other foods: Mayonnaise. Premade sauces and marinades. The items listed may not be a complete list. Talk with your dietitian about what dietary choices are right for you. Summary The Mediterranean diet includes both food and lifestyle choices. Eat a variety of fresh fruits and vegetables, beans, nuts, seeds, and whole grains. Limit the amount of red meat and sweets that you eat. Talk with your health care provider about whether it is safe for you to drink red wine in moderation. This means 1 glass a day for nonpregnant women and 2 glasses a day for men. A glass of wine equals 5 oz (150 mL). This information is not intended to replace advice given to you by your health care provider. Make sure you discuss any questions you have with your health care provider. Document Released: 07/01/2016 Document Revised: 08/03/2016 Document Reviewed: 07/01/2016 Elsevier Interactive Patient Education  2017 [employer].

## 2021-10-28 NOTE — Progress Notes (Signed)
Assessment/Plan:   Diana Tran is a very pleasant 87 y.o. 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  She had a recent trauma to the head, sustaining left orbital and left maxillary fracture treated medically, currently on steroids.  However, this changes are likely due to progression Alzheimer's disease. Case was discussed with Dr. Tomi Likens. Apraxia   Discussed safety both in and out of the home.  The patient will need 24/7 care, recommend memory care facility  Discussed the importance of regular daily schedule to maintain brain function.  Continue to monitor mood with PCP.  Stay active as tolerated Recommend naps should be no longer than 60 minutes and should not occur after 2 PM.  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 This is a 87 y.o. year old female with a history of advanced Alzheimer's dementia.  She was followed up at [facility] neurology, currently she is with her daughter, lives at [facility].  However, she does not do very well there, wants to ambulate frequently, but her balance is "off ".  According to her daughter, she walks around the building, but requires physical therapy due to issues with ambulation.  She was seen at the emergency department recently, after falling, and sustaining left orbital and left maxillary fracture, which is treated medically.  Her daughter states that the patient's memory is worse, and she states that she is not coherent, cannot finish her thoughts, and cannot follow commands as before.  Not on antidementia medications, after having significant dizziness with them "after trying the 3  medications ".  Her daughter feels that her mood is stable, although she is in not sure, as the patient is not expressing her feelings.  She sleeps well, she is not aware of vivid dreams, nightmares, or sleepwalking, or hallucinations.  She may have had a history of paranoia.  She has difficulty cooperating with bathing, and requires assistance for both bathing and dressing by the staff.  She takes her medications by her daughter and by the facility, no apparent trouble swallowing them.  She no longer cooks.  Her appetite is normal, but her daughter gives her pureed food and she crushes the medications to prevent swallowing difficulties, or choking.  She does not drink enough water.  She ambulates with "baby steps, toe walking since her accident ".  When ambulating, she likes to touch the walls frequently.  She uses a walker.  No apparent headaches, or double vision, or dizziness, focal numbness or tingling, unilateral weakness, hand tremors, or anosmia.  No recent COVID.  She has a history of urine incontinence and uses diapers, she has a history of soiling herself, but no diarrhea.  No alcohol history, or tobacco.  She may have had a history of stroke in the past, and TIAs.  She lives at [facility].  CAmbulates  toe walking and baby steps since the accident   CT of the face and head remarkable for:  left maxilla and orbital floor fractures with hemorrhage in the left maxillary sinus. No acute traumatic injury to the brain identified. Chronic cerebral volume loss and small vessel disease. Blood work on 10/24/2021 remarkable for elevated creatinine  3.04.  CBC remarkable for hemoglobin 9.8, hematocrit 30.8, normal white count and platelets.  Allergies  Allergen Reactions   Amlodipine Swelling and Other (See Comments)    Caused swelling at 10 mg   Galantamine Swelling and Other (See Comments)    Made the feet swell    Memantine Other (See Comments)    Caused extreme lethargy    Current Outpatient  Medications  Medication Instructions   amLODipine (NORVASC) 5 mg, Oral, Daily   atorvastatin (LIPITOR) 40 mg, Oral, Daily at bedtime   azithromycin (ZITHROMAX Z-PAK) 250 mg, Oral, Daily, Take 2 tablets on the first day, and 1 tablet daily thereafter until complete.   carvedilol (COREG) 6.25 mg, Oral, 2 times daily with meals   LUMIGAN 0.01 % SOLN 1 drop, Both Eyes, Daily at bedtime   meclizine (ANTIVERT) 12.5 mg, Oral, 2 times daily with meals   metFORMIN (GLUCOPHAGE) 500 mg, Oral, Daily with breakfast   methylPREDNISolone (MEDROL DOSEPAK) 4 MG TBPK tablet Take per pack instructions.   solifenacin (VESICARE) 5 mg, Oral, Daily     VITALS:   Vitals:   10/28/21 0755  Pulse: 72  Resp: 20  SpO2: 97%  Weight: 116 lb (52.6 kg)  Height: 5' (1.524 m)   No flowsheet data found.  PHYSICAL EXAM   HEENT:  Normocephalic, Left orbital and maxillary trauma, tender to palpation. The mucous membranes are moist. The superficial temporal arteries are without ropiness or tenderness. Cardiovascular: Regular rate and rhythm. Lungs: Clear to auscultation bilaterally. Neck: There are no carotid bruits noted bilaterally.  NEUROLOGICAL:  Orientation:  Alert not  oriented to person, place and time. No aphasia or dysarthria. Fund of knowledge is reduced.  Recent and remote memory impaired, attention and concentration are reduced, unable to name objects or repeat phrases. Cranial nerves: There is good facial symmetry. Extraocular muscles are intact and visual fields are full to confrontational testing. Speech is fluent and clear. Soft palate rises symmetrically and there is no tongue deviation. Hearing is intact to conversational tone. Tone: Tone is good throughout. Sensation: Sensation is intact to light touch and pinprick throughout. Vibration is intact at the bilateral big toe.There is no extinction with double simultaneous stimulation. There is no sensory dermatomal level identified. Coordination: The  patient has significant difficulty with RAM's or FNF bilaterally.  Cannot perform normal finger to nose  Motor: Strength is 5/5 in the bilateral upper and lower extremities. There is no pronator drift. There are no fasciculations noted.  Mild lip tremor is noted. Unable to heel to shin. DTR's: Deep tendon reflexes are 2/4 at the bilateral biceps, triceps, brachioradialis, patella and achilles.  Plantar responses are downgoing bilaterally.  No glabellar sign.  No Hoffmann sign Gait and Station: The patient is able to ambulate with difficulty, unable to heel toe walk without any difficulty.The patient is unable able to ambulate in a tandem fashion with balance issues. The patient is unable to stand in the Romberg position.     Thank you for allowing [place] the opportunity to participate in the care of this nice patient. Please do not hesitate to contact us for any questions or concerns.   Total time spent on today's visit was 60 minutes, including both face-to-face time and nonface-to-face time.  Time included that spent on review of records (prior notes available to me/labs/imaging if pertinent), discussing treatment and goals, answering patient's questions and coordinating care.  Cc:  Lin Landsman, MD  Sharene Butters 10/28/2021 8:31 AM

## 2021-11-02 ENCOUNTER — Inpatient Hospital Stay (HOSPITAL_COMMUNITY)
Admission: EM | Admit: 2021-11-02 | Discharge: 2021-11-22 | DRG: 682 | Disposition: E | Payer: Medicare PPO | Attending: Internal Medicine | Admitting: Internal Medicine

## 2021-11-02 ENCOUNTER — Emergency Department (HOSPITAL_COMMUNITY): Payer: Medicare PPO

## 2021-11-02 ENCOUNTER — Encounter (HOSPITAL_COMMUNITY): Payer: Self-pay | Admitting: Internal Medicine

## 2021-11-02 ENCOUNTER — Other Ambulatory Visit: Payer: Self-pay

## 2021-11-02 DIAGNOSIS — N2889 Other specified disorders of kidney and ureter: Secondary | ICD-10-CM | POA: Diagnosis present

## 2021-11-02 DIAGNOSIS — H409 Unspecified glaucoma: Secondary | ICD-10-CM | POA: Diagnosis present

## 2021-11-02 DIAGNOSIS — J189 Pneumonia, unspecified organism: Secondary | ICD-10-CM

## 2021-11-02 DIAGNOSIS — Z79899 Other long term (current) drug therapy: Secondary | ICD-10-CM

## 2021-11-02 DIAGNOSIS — Z20822 Contact with and (suspected) exposure to covid-19: Secondary | ICD-10-CM | POA: Diagnosis present

## 2021-11-02 DIAGNOSIS — E039 Hypothyroidism, unspecified: Secondary | ICD-10-CM | POA: Diagnosis present

## 2021-11-02 DIAGNOSIS — N179 Acute kidney failure, unspecified: Principal | ICD-10-CM | POA: Diagnosis present

## 2021-11-02 DIAGNOSIS — E1169 Type 2 diabetes mellitus with other specified complication: Secondary | ICD-10-CM | POA: Insufficient documentation

## 2021-11-02 DIAGNOSIS — F02C0 Dementia in other diseases classified elsewhere, severe, without behavioral disturbance, psychotic disturbance, mood disturbance, and anxiety: Secondary | ICD-10-CM | POA: Diagnosis not present

## 2021-11-02 DIAGNOSIS — F02C18 Dementia in other diseases classified elsewhere, severe, with other behavioral disturbance: Secondary | ICD-10-CM | POA: Diagnosis present

## 2021-11-02 DIAGNOSIS — R627 Adult failure to thrive: Secondary | ICD-10-CM | POA: Diagnosis present

## 2021-11-02 DIAGNOSIS — E872 Acidosis, unspecified: Secondary | ICD-10-CM | POA: Diagnosis present

## 2021-11-02 DIAGNOSIS — N189 Chronic kidney disease, unspecified: Secondary | ICD-10-CM

## 2021-11-02 DIAGNOSIS — G9341 Metabolic encephalopathy: Secondary | ICD-10-CM | POA: Diagnosis present

## 2021-11-02 DIAGNOSIS — I152 Hypertension secondary to endocrine disorders: Secondary | ICD-10-CM | POA: Diagnosis present

## 2021-11-02 DIAGNOSIS — N184 Chronic kidney disease, stage 4 (severe): Secondary | ICD-10-CM

## 2021-11-02 DIAGNOSIS — Z888 Allergy status to other drugs, medicaments and biological substances status: Secondary | ICD-10-CM

## 2021-11-02 DIAGNOSIS — R9431 Abnormal electrocardiogram [ECG] [EKG]: Secondary | ICD-10-CM

## 2021-11-02 DIAGNOSIS — F32A Depression, unspecified: Secondary | ICD-10-CM | POA: Diagnosis present

## 2021-11-02 DIAGNOSIS — E785 Hyperlipidemia, unspecified: Secondary | ICD-10-CM | POA: Diagnosis present

## 2021-11-02 DIAGNOSIS — E1122 Type 2 diabetes mellitus with diabetic chronic kidney disease: Secondary | ICD-10-CM | POA: Diagnosis present

## 2021-11-02 DIAGNOSIS — F0390 Unspecified dementia without behavioral disturbance: Secondary | ICD-10-CM | POA: Diagnosis present

## 2021-11-02 DIAGNOSIS — Z9181 History of falling: Secondary | ICD-10-CM

## 2021-11-02 DIAGNOSIS — Z515 Encounter for palliative care: Secondary | ICD-10-CM

## 2021-11-02 DIAGNOSIS — R9389 Abnormal findings on diagnostic imaging of other specified body structures: Secondary | ICD-10-CM

## 2021-11-02 DIAGNOSIS — R296 Repeated falls: Secondary | ICD-10-CM | POA: Diagnosis present

## 2021-11-02 DIAGNOSIS — D631 Anemia in chronic kidney disease: Secondary | ICD-10-CM | POA: Diagnosis present

## 2021-11-02 DIAGNOSIS — R911 Solitary pulmonary nodule: Secondary | ICD-10-CM | POA: Diagnosis present

## 2021-11-02 DIAGNOSIS — F02C11 Dementia in other diseases classified elsewhere, severe, with agitation: Secondary | ICD-10-CM | POA: Diagnosis not present

## 2021-11-02 DIAGNOSIS — Z66 Do not resuscitate: Secondary | ICD-10-CM | POA: Diagnosis not present

## 2021-11-02 DIAGNOSIS — I2510 Atherosclerotic heart disease of native coronary artery without angina pectoris: Secondary | ICD-10-CM | POA: Diagnosis present

## 2021-11-02 DIAGNOSIS — R270 Ataxia, unspecified: Secondary | ICD-10-CM | POA: Diagnosis present

## 2021-11-02 DIAGNOSIS — N186 End stage renal disease: Secondary | ICD-10-CM | POA: Diagnosis present

## 2021-11-02 DIAGNOSIS — E1159 Type 2 diabetes mellitus with other circulatory complications: Secondary | ICD-10-CM | POA: Diagnosis present

## 2021-11-02 DIAGNOSIS — E875 Hyperkalemia: Secondary | ICD-10-CM | POA: Diagnosis present

## 2021-11-02 DIAGNOSIS — E119 Type 2 diabetes mellitus without complications: Secondary | ICD-10-CM | POA: Diagnosis not present

## 2021-11-02 DIAGNOSIS — F0280 Dementia in other diseases classified elsewhere without behavioral disturbance: Secondary | ICD-10-CM | POA: Diagnosis present

## 2021-11-02 DIAGNOSIS — G309 Alzheimer's disease, unspecified: Secondary | ICD-10-CM | POA: Diagnosis present

## 2021-11-02 DIAGNOSIS — F05 Delirium due to known physiological condition: Secondary | ICD-10-CM | POA: Diagnosis present

## 2021-11-02 DIAGNOSIS — Z7189 Other specified counseling: Secondary | ICD-10-CM | POA: Diagnosis not present

## 2021-11-02 DIAGNOSIS — Z7984 Long term (current) use of oral hypoglycemic drugs: Secondary | ICD-10-CM

## 2021-11-02 DIAGNOSIS — I452 Bifascicular block: Secondary | ICD-10-CM | POA: Diagnosis present

## 2021-11-02 HISTORY — DX: Type 2 diabetes mellitus with other specified complication: E11.69

## 2021-11-02 HISTORY — DX: Chronic kidney disease, stage 3b: N18.32

## 2021-11-02 HISTORY — DX: Hypertension secondary to endocrine disorders: I15.2

## 2021-11-02 HISTORY — DX: Unspecified dementia, unspecified severity, without behavioral disturbance, psychotic disturbance, mood disturbance, and anxiety: F03.90

## 2021-11-02 HISTORY — DX: Type 2 diabetes mellitus without complications: E11.9

## 2021-11-02 LAB — CBC WITH DIFFERENTIAL/PLATELET
Abs Immature Granulocytes: 0.05 10*3/uL (ref 0.00–0.07)
Basophils Absolute: 0 10*3/uL (ref 0.0–0.1)
Basophils Relative: 0 %
Eosinophils Absolute: 0 10*3/uL (ref 0.0–0.5)
Eosinophils Relative: 0 %
HCT: 29 % — ABNORMAL LOW (ref 36.0–46.0)
Hemoglobin: 9.1 g/dL — ABNORMAL LOW (ref 12.0–15.0)
Immature Granulocytes: 1 %
Lymphocytes Relative: 6 %
Lymphs Abs: 0.6 10*3/uL — ABNORMAL LOW (ref 0.7–4.0)
MCH: 27.5 pg (ref 26.0–34.0)
MCHC: 31.4 g/dL (ref 30.0–36.0)
MCV: 87.6 fL (ref 80.0–100.0)
Monocytes Absolute: 0.4 10*3/uL (ref 0.1–1.0)
Monocytes Relative: 4 %
Neutro Abs: 8.4 10*3/uL — ABNORMAL HIGH (ref 1.7–7.7)
Neutrophils Relative %: 89 %
Platelets: 366 10*3/uL (ref 150–400)
RBC: 3.31 MIL/uL — ABNORMAL LOW (ref 3.87–5.11)
RDW: 15.5 % (ref 11.5–15.5)
WBC: 9.4 10*3/uL (ref 4.0–10.5)
nRBC: 0 % (ref 0.0–0.2)

## 2021-11-02 LAB — BASIC METABOLIC PANEL
Anion gap: 10 (ref 5–15)
BUN: 79 mg/dL — ABNORMAL HIGH (ref 8–23)
CO2: 20 mmol/L — ABNORMAL LOW (ref 22–32)
Calcium: 7.9 mg/dL — ABNORMAL LOW (ref 8.9–10.3)
Chloride: 110 mmol/L (ref 98–111)
Creatinine, Ser: 4.74 mg/dL — ABNORMAL HIGH (ref 0.44–1.00)
GFR, Estimated: 8 mL/min — ABNORMAL LOW (ref >60)
Glucose, Bld: 185 mg/dL — ABNORMAL HIGH (ref 70–99)
Potassium: 5.5 mmol/L — ABNORMAL HIGH (ref 3.5–5.1)
Sodium: 140 mmol/L (ref 135–145)

## 2021-11-02 LAB — RESP PANEL BY RT-PCR (FLU A&B, COVID) ARPGX2
Influenza A by PCR: NEGATIVE
Influenza B by PCR: NEGATIVE
SARS Coronavirus 2 by RT PCR: NEGATIVE

## 2021-11-02 MED ORDER — SENNOSIDES-DOCUSATE SODIUM 8.6-50 MG PO TABS: 1.0000 | ORAL_TABLET | Freq: Every evening | ORAL | Status: DC | PRN

## 2021-11-02 MED ORDER — HALOPERIDOL LACTATE 5 MG/ML IJ SOLN
2.0000 mg | Freq: Once | INTRAMUSCULAR | Status: AC
  Administered 2021-11-02: 2 mg via INTRAVENOUS

## 2021-11-02 MED ORDER — SODIUM CHLORIDE 0.9 % IV SOLN
1.0000 g | Freq: Once | INTRAVENOUS | Status: AC
  Administered 2021-11-02: 1 g via INTRAVENOUS
  Filled 2021-11-02: qty 10

## 2021-11-02 MED ORDER — LACTATED RINGERS IV BOLUS
1000.0000 mL | Freq: Once | INTRAVENOUS | Status: AC
  Administered 2021-11-02: 1000 mL via INTRAVENOUS

## 2021-11-02 MED ORDER — DOXYCYCLINE HYCLATE 100 MG PO TABS
100.0000 mg | ORAL_TABLET | Freq: Once | ORAL | Status: AC
  Administered 2021-11-02: 100 mg via ORAL
  Filled 2021-11-02: qty 1

## 2021-11-02 MED ORDER — SODIUM CHLORIDE (PF) 0.9 % IJ SOLN
INTRAMUSCULAR | Status: AC
  Filled 2021-11-02: qty 10

## 2021-11-02 MED ORDER — SODIUM ZIRCONIUM CYCLOSILICATE 10 G PO PACK: 10.0000 g | PACK | Freq: Once | ORAL | Status: DC

## 2021-11-02 MED ORDER — ZIPRASIDONE MESYLATE 20 MG IM SOLR
INTRAMUSCULAR | Status: AC
  Filled 2021-11-02: qty 20

## 2021-11-02 MED ORDER — ACETAMINOPHEN 325 MG PO TABS: 650.0000 mg | ORAL_TABLET | Freq: Four times a day (QID) | ORAL | Status: DC | PRN

## 2021-11-02 MED ORDER — SODIUM CHLORIDE 0.9% FLUSH
3.0000 mL | Freq: Two times a day (BID) | INTRAVENOUS | Status: DC
  Administered 2021-11-02 – 2021-11-12 (×11): 3 mL via INTRAVENOUS

## 2021-11-02 MED ORDER — ALBUTEROL SULFATE (2.5 MG/3ML) 0.083% IN NEBU
5.0000 mg | INHALATION_SOLUTION | RESPIRATORY_TRACT | Status: AC
  Administered 2021-11-02 (×3): 5 mg via RESPIRATORY_TRACT
  Filled 2021-11-02: qty 6

## 2021-11-02 MED ORDER — HALOPERIDOL LACTATE 5 MG/ML IJ SOLN
INTRAMUSCULAR | Status: AC
  Administered 2021-11-02: 2 mg via INTRAVENOUS
  Filled 2021-11-02: qty 1

## 2021-11-02 MED ORDER — MIDAZOLAM HCL 2 MG/2ML IJ SOLN
2.0000 mg | Freq: Once | INTRAMUSCULAR | Status: AC
  Administered 2021-11-02: 2 mg via INTRAVENOUS
  Filled 2021-11-02: qty 2

## 2021-11-02 MED ORDER — SODIUM CHLORIDE 0.9 % IV SOLN: INTRAVENOUS | Status: AC

## 2021-11-02 MED ORDER — INSULIN ASPART 100 UNIT/ML IJ SOLN
0.0000 [IU] | Freq: Three times a day (TID) | INTRAMUSCULAR | Status: DC
  Administered 2021-11-03: 1 [IU] via SUBCUTANEOUS
  Administered 2021-11-03 – 2021-11-06 (×6): 2 [IU] via SUBCUTANEOUS

## 2021-11-02 MED ORDER — LABETALOL HCL 5 MG/ML IV SOLN
10.0000 mg | INTRAVENOUS | Status: DC | PRN
  Administered 2021-11-03: 10 mg via INTRAVENOUS
  Filled 2021-11-02: qty 4

## 2021-11-02 MED ORDER — ACETAMINOPHEN 650 MG RE SUPP: 650.0000 mg | Freq: Four times a day (QID) | RECTAL | Status: DC | PRN

## 2021-11-02 MED ORDER — HEPARIN SODIUM (PORCINE) 5000 UNIT/ML IJ SOLN
5000.0000 [IU] | Freq: Three times a day (TID) | INTRAMUSCULAR | Status: DC
  Administered 2021-11-03 – 2021-11-09 (×19): 5000 [IU] via SUBCUTANEOUS
  Filled 2021-11-02 (×19): qty 1

## 2021-11-02 MED ORDER — SODIUM CHLORIDE 0.9 % IV BOLUS
500.0000 mL | Freq: Once | INTRAVENOUS | Status: AC
  Administered 2021-11-02: 500 mL via INTRAVENOUS

## 2021-11-02 MED ORDER — IPRATROPIUM BROMIDE 0.02 % IN SOLN
0.5000 mg | Freq: Once | RESPIRATORY_TRACT | Status: AC
  Administered 2021-11-02: 0.5 mg via RESPIRATORY_TRACT
  Filled 2021-11-02: qty 2.5

## 2021-11-02 NOTE — ED Provider Notes (Signed)
[facility] 4TH FLOOR PROGRESSIVE CARE AND UROLOGY Provider Note   CSN: [id number] Arrival date & time: 10/27/2021  1721     History Chief Complaint  Patient presents with   Altered Mental Status    Diana Tran is a 87 y.o. female.  HPI    87 year old female comes in with chief complaint of altered mental status.  Patient has history of CKD, diabetes and dementia.  Level 5 caveat for severe dementia.  Patient here with her daughter.  The daughter reports that patient has been living with her since 12-3,  When she was last seen in the ER.  Patient has subsequently seen a neurologist and PCP today.  Her overall condition has deteriorated.  Patient used to reside at memory unit, but she likely needs higher level of care.  PCP is started [facility] paperwork process.  Today however patient is weaker than usual.  She was extremely unsteady with walking.  She continues to have more falls.  Patient also has been having more wheezing over the last few days.  She was started on antibiotics few days back.  She was given a nebulizer treatment by the PCP.  Past Medical History:  Diagnosis Date   Dementia ([place])    Hyperlipidemia associated with type 2 diabetes mellitus ([place])    Hypertension associated with diabetes ([place])    Stage 3b chronic kidney disease (CKD) ([place])    Type 2 diabetes mellitus ([place])     Patient Active Problem List   Diagnosis Date Noted   Acute kidney injury superimposed on chronic kidney disease ([place]) 11/13/2021   Type 2 diabetes mellitus ([place]) 10/28/2021   Hypertension associated with diabetes ([place]) 11/13/2021   Hyperlipidemia associated with type 2 diabetes mellitus ([place]) 11/16/2021   Dementia ([place]) 11/15/2021   Hyperkalemia 11/14/2021    No past surgical history on file.   OB History   No obstetric history on file.     Family History  Problem Relation Age of Onset   Cancer Mother    Cancer Father     Social History   Tobacco Use   Smoking status:  Never   Smokeless tobacco: Never    Home Medications Prior to Admission medications   Medication Sig Start Date End Date Taking? Authorizing Provider  amLODipine (NORVASC) 5 MG tablet Take 5 mg by mouth daily. 09/03/20   [provider]  atorvastatin (LIPITOR) 40 MG tablet Take 40 mg by mouth at bedtime.    [provider]  azithromycin (ZITHROMAX Z-PAK) 250 MG tablet Take 1 tablet (250 mg total) by mouth daily. Take 2 tablets on the first day, and 1 tablet daily thereafter until complete. 10/24/21   Deatra Canter, Amjad, PA-C  carvedilol (COREG) 6.25 MG tablet Take 6.25 mg by mouth 2 (two) times daily with a meal.    [provider]  LUMIGAN 0.01 % SOLN Place 1 drop into both eyes at bedtime.  07/04/20   [provider]  meclizine (ANTIVERT) 12.5 MG tablet Take 12.5 mg by mouth 2 (two) times daily with a meal.  09/12/20   [provider]  metFORMIN (GLUCOPHAGE) 500 MG tablet Take 500 mg by mouth daily with breakfast.  09/02/20   [provider]  methylPREDNISolone (MEDROL DOSEPAK) 4 MG TBPK tablet Take per pack instructions. 07/10/21   Loeffler, Adora Fridge, PA-C  solifenacin (VESICARE) 5 MG tablet Take 5 mg by mouth daily. 09/15/20   [provider]    Allergies    Amlodipine,  Galantamine, and Memantine  Review of Systems   Review of Systems  Unable to perform ROS: Dementia   Physical Exam Updated Vital Signs BP (!) 173/133   Pulse 84   Temp 98.2 F (36.8 C) (Oral)   Resp 16   SpO2 95%   Physical Exam Vitals and nursing note reviewed.  Constitutional:      Appearance: She is well-developed.     Comments: Somnolent  HENT:     Head: Atraumatic.  Cardiovascular:     Rate and Rhythm: Tachycardia present.  Pulmonary:     Breath sounds: Wheezing present.  Abdominal:     General: There is no distension.     Tenderness: There is no abdominal tenderness.  Musculoskeletal:     Cervical back: Normal range of motion and neck supple.   Skin:    General: Skin is warm and dry.     Findings: Bruising present.  Neurological:     Mental Status: She is disoriented.    ED Results / Procedures / Treatments   Labs (all labs ordered are listed, but only abnormal results are displayed) Labs Reviewed  BASIC METABOLIC PANEL - Abnormal; Notable for the following components:      Result Value   Potassium 5.5 (*)    CO2 20 (*)    Glucose, Bld 185 (*)    BUN 79 (*)    Creatinine, Ser 4.74 (*)    Calcium 7.9 (*)    GFR, Estimated 8 (*)    All other components within normal limits  CBC WITH DIFFERENTIAL/PLATELET - Abnormal; Notable for the following components:   RBC 3.31 (*)    Hemoglobin 9.1 (*)    HCT 29.0 (*)    Neutro Abs 8.4 (*)    Lymphs Abs 0.6 (*)    All other components within normal limits  RESP PANEL BY RT-PCR (FLU A&B, COVID) ARPGX2  URINALYSIS, ROUTINE W REFLEX MICROSCOPIC  CBC  BASIC METABOLIC PANEL  SODIUM, URINE, RANDOM  CREATININE, URINE, RANDOM  HEMOGLOBIN A1C    EKG EKG Interpretation  Date/Time:  Monday November 02 2021 20:33:58 EST Ventricular Rate:  80 PR Interval:  131 QRS Duration: 98 QT Interval:  461 QTC Calculation: 532 R Axis:   -73 Text Interpretation: Sinus rhythm Left anterior fascicular block Abnormal R-wave progression, early transition Left ventricular hypertrophy Nonspecific T abnormalities, anterior leads Prolonged QT interval No acute changes QT prolonged Confirmed by Varney Biles [phone]) on 11/13/2021 8:54:24 PM  Radiology DG Chest Port 1 View  Result Date: 10/29/2021 CLINICAL DATA:  Wheezing, dementia EXAM: PORTABLE CHEST 1 VIEW COMPARISON:  CT chest dated 10/24/2021 FINDINGS: Mild right perihilar opacity. Left basilar opacity, likely atelectasis. No pleural effusion or pneumothorax. Cardiomegaly. IMPRESSION: Mild right perihilar and left basilar opacity, likely atelectasis. Right lower lobe pneumonia is not entirely excluded. Electronically Signed   By: Julian Hy M.D.   On: 11/01/2021 19:03    Procedures .Critical Care Performed by: Varney Biles, MD Authorized by: Varney Biles, MD   Critical care provider statement:    Critical care time (minutes):  35   Critical care was necessary to treat or prevent imminent or life-threatening deterioration of the following conditions:  CNS failure or compromise and renal failure   Critical care was time spent personally by me on the following activities:  Development of treatment plan with patient or surrogate, discussions with consultants, evaluation of patient's response to treatment, examination of patient, ordering and review of laboratory studies, ordering and  review of radiographic studies, ordering and performing treatments and interventions, pulse oximetry, re-evaluation of patient's condition and review of old charts   Medications Ordered in ED Medications  ziprasidone (GEODON) 20 MG injection (  Not Given 11/06/2021 1845)  sodium chloride (PF) 0.9 % injection (  Not Given 10/22/2021 1846)  heparin injection 5,000 Units (has no administration in time range)  sodium chloride flush (NS) 0.9 % injection 3 mL (3 mLs Intravenous Given 11/11/2021 2342)  0.9 %  sodium chloride infusion (has no administration in time range)  sodium zirconium cyclosilicate (LOKELMA) packet 10 g (10 g Oral Not Given 11/13/2021 2341)  acetaminophen (TYLENOL) tablet 650 mg (has no administration in time range)    Or  acetaminophen (TYLENOL) suppository 650 mg (has no administration in time range)  senna-docusate (Senokot-S) tablet 1 tablet (has no administration in time range)  insulin aspart (novoLOG) injection 0-9 Units (has no administration in time range)  labetalol (NORMODYNE) injection 10 mg (has no administration in time range)  lactated ringers bolus 1,000 mL (0 mLs Intravenous Stopped 10/30/2021 2221)  albuterol (PROVENTIL) (2.5 MG/3ML) 0.083% nebulizer solution 5 mg (5 mg Nebulization Given 10/27/2021 1859)   ipratropium (ATROVENT) nebulizer solution 0.5 mg (0.5 mg Nebulization Given 10/27/2021 1851)  haloperidol lactate (HALDOL) injection 2 mg (2 mg Intravenous Given 11/01/2021 1931)  lactated ringers bolus 1,000 mL (1,000 mLs Intravenous New Bag/Given 11/14/2021 2220)  cefTRIAXone (ROCEPHIN) 1 g in sodium chloride 0.9 % 100 mL IVPB (0 g Intravenous Stopped 11/20/2021 2221)  doxycycline (VIBRA-TABS) tablet 100 mg (100 mg Oral Given 10/29/2021 2118)  midazolam (VERSED) injection 2 mg (2 mg Intravenous Given 11/01/2021 2124)  sodium chloride 0.9 % bolus 500 mL (0 mLs Intravenous Stopped 11/01/2021 2221)    ED Course  I have reviewed the triage vital signs and the nursing notes.  Pertinent labs & imaging results that were available during my care of the patient were reviewed by me and considered in my medical decision making (see chart for details).    MDM Rules/Calculators/A&P                           87 year old comes in with chief complaint of altered mental status.  Patient is having increased falls, more wheezing.  Recently treated for pneumonia. She resides at memory unit, but probably needs higher level of care.  Daughter reports that patient has had increased confusion, falls and poor p.o. intake.  Basic labs ordered.  Patient has acute on chronic renal failure with worsening uremia.  That could be contributing to her somnolence and confusion.  Family does not want dialysis.  Patient is however full code.  Family to have further conversation on goals of care.  Questionable pneumonia clinically, we will give her ceftriaxone and doxycycline.  Patient also noted to have QT prolongation.  Final Clinical Impression(s) / ED Diagnoses Final diagnoses:  Community acquired pneumonia, unspecified laterality  AKI (acute kidney injury) ([place])  Prolonged Q-T interval on ECG    Rx / DC Orders ED Discharge Orders     None        Varney Biles, MD 11/03/2021 2352

## 2021-11-02 NOTE — H&P (Signed)
History and Physical    [name] ZYS:[id number] DOB: [birth year] DOA: 11/09/2021  PCP: Lin Landsman, MD  Patient coming from: Home via EMS  I have personally briefly reviewed patient's old medical records in [facility]  Chief Complaint: Worsening dementia, fall  HPI: Diana Tran is a 87 y.o. female with medical history significant for advanced dementia, CKD stage IIIb, T2DM, HTN, HLD, depression who presented to the ED via EMS for evaluation of frequent falls and worsening dementia.  Patient is unable to provide any history due to dementia and somnolence and is otherwise obtained by EDP and chart review.  Patient was previously residing in a memory unit.  She had a fall on 12/3 and found to have a closed fracture of her left orbital floor.  She saw ENT in follow-up who felt no intervention was needed.  She was also noted to have new ataxia.  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  Her recent changes were felt likely due to progression of her Alzheimer's disease.  Patient was reportedly taken out of her memory unit and has been at home with attempts to place in an alternate memory unit.  She was brought to the ED as she was very difficult to arouse at home.  ED Course:  Initial vitals showed BP 195/117, pulse 75, RR 97.0 F, SPO2 97% on room air.  Labs show BUN 79, creatinine 4.74 (previously 3.04 on 10/24/2021 with baseline which appears to be 1.7-2.0 last year), potassium 5.5, sodium 140, bicarb 20, serum glucose 185.  WBC 9.4, hemoglobin 9.1, platelets 366,000.  SARS-CoV-2 and influenza PCR negative.  Urinalysis ordered and pending collection.  Portable chest x-ray shows mild right perihilar and left basilar opacity, likely atelectasis.  Patient was given 2.5 L LR, IV ceftriaxone and oral doxycycline, IV Haldol 2 mg and IV Versed 2 mg.  She received albuterol and Atrovent nebulizers.  The hospitalist service was consulted to admit for  further evaluation and management.  Review of Systems:  Unable to obtain full review of systems due to dementia.   Past Medical History:  Diagnosis Date   Dementia ([place])    Hyperlipidemia associated with type 2 diabetes mellitus ([place])    Hypertension associated with diabetes ([place])    Stage 3b chronic kidney disease (CKD) ([place])    Type 2 diabetes mellitus ([place])     No past surgical history on file.  Social History:  reports that she has never smoked. She has never used smokeless tobacco. No history on file for alcohol use and drug use.  Allergies  Allergen Reactions   Amlodipine Swelling and Other (See Comments)    Caused swelling at 10 mg   Galantamine Swelling and Other (See Comments)    Made the feet swell    Memantine Other (See Comments)    Caused extreme lethargy    Family History  Problem Relation Age of Onset   Cancer Mother    Cancer Father      Prior to Admission medications   Medication Sig Start Date End Date Taking? Authorizing Provider  amLODipine (NORVASC) 5 MG tablet Take 5 mg by mouth daily. 09/03/20   [provider]  atorvastatin (LIPITOR) 40 MG tablet Take 40 mg by mouth at bedtime.    [provider]  azithromycin (ZITHROMAX Z-PAK) 250 MG tablet Take 1 tablet (250 mg total) by mouth daily. Take 2 tablets on the first day, and 1 tablet  daily thereafter until complete. 10/24/21   Deatra Canter, Amjad, PA-C  carvedilol (COREG) 6.25 MG tablet Take 6.25 mg by mouth 2 (two) times daily with a meal.    [provider]  LUMIGAN 0.01 % SOLN Place 1 drop into both eyes at bedtime.  07/04/20   [provider]  meclizine (ANTIVERT) 12.5 MG tablet Take 12.5 mg by mouth 2 (two) times daily with a meal.  09/12/20   [provider]  metFORMIN (GLUCOPHAGE) 500 MG tablet Take 500 mg by mouth daily with breakfast.  09/02/20   [provider]  methylPREDNISolone (MEDROL DOSEPAK) 4 MG TBPK tablet Take per pack instructions.  07/10/21   Loeffler, Adora Fridge, PA-C  solifenacin (VESICARE) 5 MG tablet Take 5 mg by mouth daily. 09/15/20   [provider]    Physical Exam: Vitals:   10/31/2021 1736 11/04/2021 1900 10/25/2021 2100  BP: (!) 195/117 (!) 151/140 (!) 175/129  Pulse: 75 80 80  Resp: 18 18 (!) 26  Temp: (!) 97 F (36.1 C)    TempSrc: Axillary    SpO2: 97% 99% 98%   Exam limited due to dementia and somnolence. Constitutional: Elderly woman resting in bed in the right lateral decubitus position, hypersomnolent after receiving sedating meds and difficult to arouse Eyes: lids and conjunctivae normal HENMT: Bruising left upper cheekbone and left upper forehead without open wound Neck: normal, supple, no masses. Respiratory: clear to auscultation bilaterally, no wheezing, no crackles. Normal respiratory effort. No accessory muscle use.  Cardiovascular: Regular rate and rhythm, no murmurs / rubs / gallops. No extremity edema. 2+ pedal pulses. Abdomen: no tenderness, no masses palpated. Bowel sounds positive.  Musculoskeletal: Not cooperative with exam to assess ROM.  No obvious large joint abnormality.  Safety mittens in place both hands. Skin: Bruising left upper cheekbone on left upper forehead without open wound. Neurologic: Exam limited due to dementia/hypersomnolence Psychiatric: Hypersomnolent.  Labs on Admission: I have personally reviewed following labs and imaging studies  CBC: Recent Labs  Lab 11/01/2021 1824  WBC 9.4  NEUTROABS 8.4*  HGB 9.1*  HCT 29.0*  MCV 87.6  PLT 665   Basic Metabolic Panel: Recent Labs  Lab 11/06/2021 1824  NA 140  K 5.5*  CL 110  CO2 20*  GLUCOSE 185*  BUN 79*  CREATININE 4.74*  CALCIUM 7.9*   GFR: Estimated Creatinine Clearance: 6 mL/min (A) (by C-G formula based on SCr of 4.74 mg/dL (H)). Liver Function Tests: No results for input(s): AST, ALT, ALKPHOS, BILITOT, PROT, ALBUMIN in the last 168 hours. No results for input(s): LIPASE, AMYLASE in the last  168 hours. No results for input(s): AMMONIA in the last 168 hours. Coagulation Profile: No results for input(s): INR, PROTIME in the last 168 hours. Cardiac Enzymes: No results for input(s): CKTOTAL, CKMB, CKMBINDEX, TROPONINI in the last 168 hours. BNP (last 3 results) No results for input(s): PROBNP in the last 8760 hours. HbA1C: No results for input(s): HGBA1C in the last 72 hours. CBG: No results for input(s): GLUCAP in the last 168 hours. Lipid Profile: No results for input(s): CHOL, HDL, LDLCALC, TRIG, CHOLHDL, LDLDIRECT in the last 72 hours. Thyroid Function Tests: No results for input(s): TSH, T4TOTAL, FREET4, T3FREE, THYROIDAB in the last 72 hours. Anemia Panel: No results for input(s): VITAMINB12, FOLATE, FERRITIN, TIBC, IRON, RETICCTPCT in the last 72 hours. Urine analysis:    Component Value Date/Time   COLORURINE YELLOW 09/24/2020 [place] 09/24/2020 1449   LABSPEC 1.011 09/24/2020  [place] 7.0 09/24/2020 1449   GLUCOSEU 50 (A) 09/24/2020 1449   HGBUR NEGATIVE 09/24/2020 1449   BILIRUBINUR NEGATIVE 09/24/2020 1449   KETONESUR NEGATIVE 09/24/2020 1449   PROTEINUR >=300 (A) 09/24/2020 1449   NITRITE NEGATIVE 09/24/2020 1449   LEUKOCYTESUR NEGATIVE 09/24/2020 1449    Radiological Exams on Admission: DG Chest Port 1 View  Result Date: 11/09/2021 CLINICAL DATA:  Wheezing, dementia EXAM: PORTABLE CHEST 1 VIEW COMPARISON:  CT chest dated 10/24/2021 FINDINGS: Mild right perihilar opacity. Left basilar opacity, likely atelectasis. No pleural effusion or pneumothorax. Cardiomegaly. IMPRESSION: Mild right perihilar and left basilar opacity, likely atelectasis. Right lower lobe pneumonia is not entirely excluded. Electronically Signed   By: Julian Hy M.D.   On: 11/15/2021 19:03    EKG: Personally reviewed. Sinus rhythm, LAFB, LVH, QTC 532.  When compared to prior QTC more prolonged however PVCs no longer present.  Assessment/Plan Principal  Problem:   Acute kidney injury superimposed on chronic kidney disease (HCC) Active Problems:   Type 2 diabetes mellitus (HCC)   Hypertension associated with diabetes ([place])   Hyperlipidemia associated with type 2 diabetes mellitus (HCC)   Dementia (HCC)   Hyperkalemia   Diana Tran is a 87 y.o. female with medical history significant for advanced dementia, CKD stage IIIb, T2DM, HTN, HLD, depression who is admitted with AKI on CKD stage IIIb.  Acute kidney injury superimposed on CKD stage IIIb: Creatinine 4.74 on admission, previously 3.04 on 12/3.  Baseline labs from last year showed creatinine ranging 1.7-2.0.  Recent CT imaging did show masslike area upper pole of right kidney. -Continue IV fluid hydration with NS@100  mL/hour overnight -Obtain urine studies -Obtain renal ultrasound -Repeat labs in a.m. -Hold metformin  Hyperkalemia: Potassium 5.5 in setting of AKI on CKD stage IIIb.  Continue IV fluid hydration as above.  Give Lokelma if she will cooperate to take oral meds.  Advanced dementia with failure to thrive: Has reported history of advanced Alzheimer's dementia, previously in memory unit but is now living with family with plans to admit to alternate memory unit.  Dementia seems to be progressing.  Would benefit from continued palliative/goals of care discussions.  Right perihilar and left basilar opacities seen on chest x-ray: Likely atelectasis.  No clinical signs of pneumonia present on admission.  Hold further antibiotics for now.  Type 2 diabetes: Holding metformin.  Place on SSI.  Hypertension: Home antihypertensives held as she has not taken oral medications.  Use IV labetalol as needed.  Hyperlipidemia: Resume statin when able.  Recent left orbital floor fracture: Seen by ENT 12/9, no intervention needed.  Incidental finding CT chest 10/24/2021: Discrete 6 mm pulmonary nodule and areas of groundglass seen, noncontrast chest CT at 3-6 months recommended.   Upper pole of right kidney shows a lobular masslike area extending from the upper pole of the right kidney measuring 4.3 x 4.6 cm.  Obtaining renal ultrasound as above.  DVT prophylaxis: Subcutaneous heparin Code Status: Full code, would benefit from further discussions Family Communication: None available on admission Disposition Plan: From home, anticipate needs admission to memory unit on discharge Consults called: None Level of care: Telemetry Admission status:  Status is: Inpatient  Remains inpatient appropriate because: Admitted with acute renal failure superimposed on CKD stage IIIb requiring continued IV fluid hydration and assessment with renal ultrasound and follow-up labs.  Has advanced dementia which appears to be progressing and unsafe to discharge to home at this time.  Zada Finders MD  [facility]  If 7PM-7AM, please contact night-coverage [email or web address]  11/21/2021, 10:10 PM

## 2021-11-02 NOTE — ED Triage Notes (Signed)
Per EMS worsening dementia. Has fallen multiple times in last 2 weeks.Went to MD this morning and they stated her dementia is worsening. No falls since Dr visit this morning. Daughter called EMS due to patient being difficult to arouse.    156/102 BP CBG 318 98 HR  Disoriented x4

## 2021-11-02 NOTE — Progress Notes (Signed)
Transition of Care [facility]) - Emergency Department Mini Assessment   Patient Details  Name: Diana Tran MRN: [id number] Date of Birth: 11-08-1932  [facility]) CM/SW Contact:    Gaetano Hawthorne Tarpley-Carter, [place] Phone Number: 10/27/2021, 8:59 PM   Clinical Narrative: TOC CSW spoke with pts daughter Diana Tran [phone].  Diana stated she currently getting assistance from Dr. Jackson Latino with admission into to another memory care facility.  Pt is currently at [facility], but memory care there can no longer meet pts needs.    Brenn Deziel Tarpley-Carter, MSW, LCSW-A Pronouns:  She/Her/Hers [facility] Health[facility]itions of Care Clinical Social Worker Direct Number:  [phone] [email or web address]    ED Mini Assessment: What brought you to the Emergency Department? : Fall  Barriers to Discharge: No Barriers Identified     Means of departure: Not know  Interventions which prevented an admission or readmission: SNF Placement    Patient Contact and Communications Key Contact 1: [place] with: Diana Contact Date: 11/21/2021,     Contact Phone Number: [phone] Call outcome: Diana is seeking assistance from Dr. Jackson Latino for search of memory care.  Patient states their goals for this hospitalization and ongoing recovery are:: N/A   Choice offered to / list presented to : NA  Admission diagnosis:  AMS There are no problems to display for this patient.  PCP:  Lin Landsman, MD Pharmacy:   [facility], [place] [place]. [place] [address] Phone: [phone] Fax: [phone]

## 2021-11-03 ENCOUNTER — Inpatient Hospital Stay (HOSPITAL_COMMUNITY): Payer: Medicare PPO

## 2021-11-03 DIAGNOSIS — N2889 Other specified disorders of kidney and ureter: Secondary | ICD-10-CM

## 2021-11-03 DIAGNOSIS — N184 Chronic kidney disease, stage 4 (severe): Secondary | ICD-10-CM | POA: Diagnosis not present

## 2021-11-03 DIAGNOSIS — G309 Alzheimer's disease, unspecified: Secondary | ICD-10-CM | POA: Diagnosis not present

## 2021-11-03 DIAGNOSIS — R9389 Abnormal findings on diagnostic imaging of other specified body structures: Secondary | ICD-10-CM

## 2021-11-03 DIAGNOSIS — R9431 Abnormal electrocardiogram [ECG] [EKG]: Secondary | ICD-10-CM

## 2021-11-03 DIAGNOSIS — R627 Adult failure to thrive: Secondary | ICD-10-CM | POA: Diagnosis not present

## 2021-11-03 DIAGNOSIS — R911 Solitary pulmonary nodule: Secondary | ICD-10-CM

## 2021-11-03 DIAGNOSIS — E875 Hyperkalemia: Secondary | ICD-10-CM | POA: Diagnosis not present

## 2021-11-03 DIAGNOSIS — N179 Acute kidney failure, unspecified: Secondary | ICD-10-CM | POA: Diagnosis not present

## 2021-11-03 LAB — BASIC METABOLIC PANEL
Anion gap: 11 (ref 5–15)
BUN: 72 mg/dL — ABNORMAL HIGH (ref 8–23)
CO2: 15 mmol/L — ABNORMAL LOW (ref 22–32)
Calcium: 7.2 mg/dL — ABNORMAL LOW (ref 8.9–10.3)
Chloride: 112 mmol/L — ABNORMAL HIGH (ref 98–111)
Creatinine, Ser: 4.3 mg/dL — ABNORMAL HIGH (ref 0.44–1.00)
GFR, Estimated: 9 mL/min — ABNORMAL LOW (ref >60)
Glucose, Bld: 212 mg/dL — ABNORMAL HIGH (ref 70–99)
Potassium: 5.4 mmol/L — ABNORMAL HIGH (ref 3.5–5.1)
Sodium: 138 mmol/L (ref 135–145)

## 2021-11-03 LAB — GLUCOSE, CAPILLARY
Glucose-Capillary: 165 mg/dL — ABNORMAL HIGH (ref 70–99)
Glucose-Capillary: 156 mg/dL — ABNORMAL HIGH (ref 70–99)
Glucose-Capillary: 144 mg/dL — ABNORMAL HIGH (ref 70–99)
Glucose-Capillary: 142 mg/dL — ABNORMAL HIGH (ref 70–99)

## 2021-11-03 LAB — HEMOGLOBIN A1C
Hgb A1c MFr Bld: 7.9 % — ABNORMAL HIGH (ref 4.8–5.6)
Mean Plasma Glucose: 180.03 mg/dL

## 2021-11-03 LAB — CBC
HCT: 26.8 % — ABNORMAL LOW (ref 36.0–46.0)
Hemoglobin: 8.4 g/dL — ABNORMAL LOW (ref 12.0–15.0)
MCH: 27.4 pg (ref 26.0–34.0)
MCHC: 31.3 g/dL (ref 30.0–36.0)
MCV: 87.3 fL (ref 80.0–100.0)
Platelets: 331 10*3/uL (ref 150–400)
RBC: 3.07 MIL/uL — ABNORMAL LOW (ref 3.87–5.11)
RDW: 15.7 % — ABNORMAL HIGH (ref 11.5–15.5)
WBC: 10 10*3/uL (ref 4.0–10.5)
nRBC: 0.2 % (ref 0.0–0.2)

## 2021-11-03 MED ORDER — LACTATED RINGERS IV SOLN: INTRAVENOUS | Status: DC

## 2021-11-03 MED ORDER — LORAZEPAM 2 MG/ML IJ SOLN: 0.2500 mg | Freq: Once | INTRAMUSCULAR | Status: DC

## 2021-11-03 MED ORDER — LORAZEPAM 2 MG/ML IJ SOLN
0.5000 mg | Freq: Four times a day (QID) | INTRAMUSCULAR | Status: DC | PRN
  Administered 2021-11-03 – 2021-11-10 (×5): 0.5 mg via INTRAVENOUS
  Filled 2021-11-03 (×5): qty 1

## 2021-11-03 NOTE — Assessment & Plan Note (Signed)
--   When oral intake improves can resume carvedilol

## 2021-11-03 NOTE — Assessment & Plan Note (Signed)
--   Hemoglobin A1c 7.9, CBG stable, continue sliding scale insulin, metformin on hold

## 2021-11-03 NOTE — Assessment & Plan Note (Signed)
--  Discrete 6 mm pulmonary nodule and areas of groundglass seen, noncontrast chest CT at 3-6 months recommended.

## 2021-11-03 NOTE — Plan of Care (Signed)
°  Problem: Clinical Measurements: °Goal: Ability to maintain clinical measurements within normal limits will improve °Outcome: Progressing °  °Problem: Clinical Measurements: °Goal: Diagnostic test results will improve °Outcome: Progressing °  °

## 2021-11-03 NOTE — Hospital Course (Addendum)
87 year old woman PMH advanced dementia, CKD stage IV presented with history of frequent falls and worsening dementia, failure to thrive. --Admitted for acute kidney injury superimposed on CKD, secondary to poor oral intake secondary to advanced dementia.

## 2021-11-03 NOTE — Assessment & Plan Note (Signed)
--  avoid agents that may prolong QT, if possible

## 2021-11-03 NOTE — Assessment & Plan Note (Signed)
--   Baseline creatinine 2-3.  Modest improvement today, continue fluids, check BMP in AM.  Renal ultrasound showed polycystic kidney disease.  Continue to hold metformin.

## 2021-11-03 NOTE — Progress Notes (Addendum)
Progress Note YAZHINI MCAULAY   WGN:[id number]  DOB: 08-31-1932  DOA: 10/29/2021     1 Date of Service: 11/03/2021   Clinical Course 87 year old woman PMH advanced dementia, CKD stage IV presented with history of frequent falls and worsening dementia, failure to thrive. --Admitted for acute kidney injury superimposed on CKD, secondary to poor oral intake secondary to advanced dementia.  Assessment and Plan * Acute kidney injury superimposed on chronic kidney disease ([place]) -- Baseline creatinine 2-3.  Modest improvement today, continue fluids, check BMP in AM.  Renal ultrasound showed polycystic kidney disease.  Continue to hold metformin.  Alzheimer's dementia ([place]) -- With associated failure to thrive secondary to poor oral intake.  Daughter feels patient cannot return to current memory unit, will need further care, will involve TOC. -- Discussed goals of care with daughter and son, see separate note, in brief patient remains full code  CKD (chronic kidney disease), stage IV ([place]) --treat AKI  Adult failure to thrive -- Continue IV fluids, diet as tolerated, ongoing palliative care discussion  Hyperkalemia -- Modest, secondary to acute kidney injury, continue hydration, monitor  Type 2 diabetes mellitus (HCC) -- Hemoglobin A1c 7.9, CBG stable, continue sliding scale insulin, metformin on hold  Kidney mass CT showed upper pole of right kidney shows a lobular masslike area extending from the upper pole of the right kidney measuring 4.3 x 4.6 cm. U/s was unremarkable. Can follow-up as an outpt if indicated based on goals of care.  Pulmonary nodule --Discrete 6 mm pulmonary nodule and areas of groundglass seen, noncontrast chest CT at 3-6 months recommended.   Abnormal chest x-ray -- Suspect atelectasis, patient is afebrile, no hypoxia, doubt pneumonia.  Continue to withhold antibiotics.  Prolonged QT interval --avoid agents that may prolong QT, if possible  Hypertension  associated with diabetes ([place]) -- When oral intake improves can resume carvedilol  Will involve Palliative Team  Subjective:  Confused secondary to dementia, not able to offer any history.  Per daughter to the patient drink Ensure this morning and is more talkative than usual.  Has had numerous falls, failure to thrive, poor oral intake.  Seems to have significantly worsened since her fall recently resulting in orbital fracture.  Objective Vitals:   11/03/21 0327 11/03/21 0341 11/03/21 0740 11/03/21 1352  BP: (!) 167/89  (!) 168/130 (!) 144/109  Pulse:  94 83 83  Resp:  18 18 18   Temp:   97.7 F (36.5 C) 97.7 F (36.5 C)  TempSrc:   Oral Oral  SpO2:  92% 100% 98%     Vital signs were reviewed and unremarkable.  Exam Physical Exam Constitutional:      General: She is not in acute distress.    Appearance: She is not ill-appearing or toxic-appearing.  Cardiovascular:     Rate and Rhythm: Normal rate and regular rhythm.     Heart sounds: No murmur heard. Pulmonary:     Effort: Pulmonary effort is normal. No respiratory distress.     Breath sounds: No wheezing, rhonchi or rales.  Musculoskeletal:     Right lower leg: No edema.     Left lower leg: No edema.  Neurological:     Mental Status: She is alert.  Psychiatric:     Comments: Confused, cannot assess mood or affect, does respond to questions verbally.    Labs / Other Information My review of labs, imaging, notes and other tests is significant for     Potassium 5.4, creatinine down to  4.3, hemoglobin down slightly at 8.4  Disposition Plan: Status is: Inpatient  Remains inpatient appropriate because: Acute kidney injury  Discussed in detail with daughter at bedside and son by telephone, they report they are joint healthcare POA  heparin  Time spent: 25 minutes [facility] 11/03/2021, 3:42 PM

## 2021-11-03 NOTE — Plan of Care (Signed)
Patient will be able to walk with PT as she did prior to arrival

## 2021-11-03 NOTE — NC FL2 (Signed)
[place] MEDICAID FL2 LEVEL OF CARE SCREENING TOOL     IDENTIFICATION  Patient Name: KADYN CHOVAN Birthdate: 09-23-1932 Sex: female Admission Date (Current Location): 11/21/2021  [facility] and [place] Number:  Herbalist and Address:  [facility],  [address] [place], [place]      Provider Number: [id number]  Attending Physician Name and Address:  Samuella Cota, MD  Relative Name and Phone Number:  Brazil-Watley,[name] Daughter   [phone], Delinda, Malan   [phone]    Current Level of Care: Hospital Recommended Level of Care: [place] Prior Approval Number:    Date Approved/Denied:   PASRR Number: [id number]  Discharge Plan: SNF    Current Diagnoses: Patient Active Problem List   Diagnosis Date Noted   Acute kidney injury superimposed on chronic kidney disease ([place]) 11/17/2021   Type 2 diabetes mellitus ([place]) 10/24/2021   Hypertension associated with diabetes ([place]) 10/31/2021   Hyperlipidemia associated with type 2 diabetes mellitus ([place]) 10/26/2021   Dementia ([place]) 11/03/2021   Hyperkalemia 11/07/2021    Orientation RESPIRATION BLADDER Height & Weight     Self  Other (Comment) (right buttock tear) Incontinent Weight:   Height:     BEHAVIORAL SYMPTOMS/MOOD NEUROLOGICAL BOWEL NUTRITION STATUS      Incontinent Diet  AMBULATORY STATUS COMMUNICATION OF NEEDS Skin   Extensive Assist Verbally Other (Comment)                       Personal Care Assistance Level of Assistance  Total care       Total Care Assistance: Maximum assistance   Functional Limitations Info  Sight, Hearing, Speech Sight Info: Adequate Hearing Info: Adequate Speech Info: Adequate    SPECIAL CARE FACTORS FREQUENCY  PT (By licensed PT), OT (By licensed OT)     PT Frequency: Eval and Treat OT Frequency: Eval and Treat            Contractures Contractures Info: Not present    Additional Factors Info   Insulin Sliding Scale, Code Status, Allergies Code Status Info: FULL Allergies Info: Amlodipine, Galantamine, Memantine   Insulin Sliding Scale Info: See discharge summary       Current Medications (11/03/2021):  This is the current hospital active medication list Current Facility-Administered Medications  Medication Dose Route Frequency Provider Last Rate Last Admin   0.9 %  sodium chloride infusion   Intravenous Continuous Lenore Cordia, MD 100 mL/hr at 11/03/21 0105 New Bag at 11/03/21 0105   acetaminophen (TYLENOL) tablet 650 mg  650 mg Oral Q6H PRN Lenore Cordia, MD       Or   acetaminophen (TYLENOL) suppository 650 mg  650 mg Rectal Q6H PRN Lenore Cordia, MD       heparin injection 5,000 Units  5,000 Units Subcutaneous Q8H Zada Finders R, MD   5,000 Units at 11/03/21 0530   insulin aspart (novoLOG) injection 0-9 Units  0-9 Units Subcutaneous TID WC Lenore Cordia, MD   2 Units at 11/03/21 0850   labetalol (NORMODYNE) injection 10 mg  10 mg Intravenous Q4H PRN Lenore Cordia, MD   10 mg at 11/03/21 0324   LORazepam (ATIVAN) injection 0.25 mg  0.25 mg Intravenous Once Kathryne Eriksson, NP       senna-docusate (Senokot-S) tablet 1 tablet  1 tablet Oral QHS PRN Lenore Cordia, MD       sodium chloride flush (NS) 0.9 % injection 3  mL  3 mL Intravenous Q12H Zada Finders R, MD   3 mL at 11/03/21 0924   sodium zirconium cyclosilicate (LOKELMA) packet 10 g  10 g Oral Once Lenore Cordia, MD         Discharge Medications: Please see discharge summary for a list of discharge medications.  Relevant Imaging Results:  Relevant Lab Results:   Additional Information [id number]  Purcell Mouton, RN

## 2021-11-03 NOTE — Assessment & Plan Note (Signed)
--   Continue IV fluids, diet as tolerated, ongoing palliative care discussion

## 2021-11-03 NOTE — Plan of Care (Signed)
°  Interdisciplinary Goals of Care Family Meeting   Date carried out:: 11/03/2021  Location of the meeting: Bedside  Member's involved: Physician and Family Member or next of kin Son by telephone, daughter at bedside  Durable Power of Attorney or acting medical decision maker: Reportedly son and daughter  Discussion: We discussed patient's history, diagnosed with dementia 2008, significant decline in the last several years, more rapid decline since most recent fall resulting in orbital fracture.  Multiple falls at home, not doing well at current memory care.  Very poor oral intake.  We discussed acute kidney injury and treatment, we discussed the natural trajectory of Alzheimer's dementia, likelihood of recurrent failure to thrive resulting in acute kidney injury.  Patient not a dialysis candidate.  We discussed goals of care, including full code, DNR, hospice.  Code status: Full Code family elected full code.  Continue current treatment.  Disposition: Continue current acute care  Time spent for the meeting: 25 minutes  Murray Hodgkins 11/03/2021, 3:43 PM

## 2021-11-03 NOTE — Assessment & Plan Note (Addendum)
--   Suspect atelectasis, patient is afebrile, no hypoxia, doubt pneumonia.  Continue to withhold antibiotics.

## 2021-11-03 NOTE — Assessment & Plan Note (Signed)
--   With associated failure to thrive secondary to poor oral intake.  Daughter feels patient cannot return to current memory unit, will need further care, will involve TOC. -- Discussed goals of care with daughter and son, see separate note, in brief patient remains full code

## 2021-11-03 NOTE — Assessment & Plan Note (Signed)
--  treat AKI

## 2021-11-03 NOTE — Assessment & Plan Note (Signed)
--   Modest, secondary to acute kidney injury, continue hydration, monitor

## 2021-11-03 NOTE — Evaluation (Signed)
Physical Therapy Evaluation Patient Details Name: Diana Tran MRN: [id number] DOB: 07/28/1932 Today's Date: 11/03/2021  History of Present Illness  87 year old female comnes to Ed 11/06/2021 with falls, worsened  mental status.  Patient has history of CKD, diabetes and dementia., daughtrer has cared for patient since 12/3 after ED visit. Has been in memory care, needing higher level of care.  Clinical Impression   Patient  sleeping, aroused with stimulation. Patient  did not follow any directions, Patient constantly pulling on mitts, noted increased wheezes. Patient allowed staff to change linens  and get washed up. Patient sat upright in bed and moved legs over bed edge and sat x  1 minute with close guarding while bed changed. Marland Kitchen Required max cues/assist to lie back down. Patient Diana Tran 'thank you " x 2.  Patient appears  ready for long term  care with close supervision. Pt admitted with above diagnosis. Will provide trial PT to establish level of care and decrease burden of care if patient able to participate.      Recommendations for follow up therapy are one component of a multi-disciplinary discharge planning process, led by the attending physician.  Recommendations may be updated based on patient status, additional functional criteria and insurance authorization.  Follow Up Recommendations Long term Skilled nursing-Np PT recommended.    Assistance Recommended at Discharge Frequent or constant Supervision/Assistance  Functional Status Assessment Patient has had a recent decline in their functional status and/or demonstrates limited ability to make significant improvements in function in a reasonable and predictable amount of time  Equipment Recommendations  None recommended by PT    Recommendations for Other Services       Precautions / Restrictions Precautions Precautions: Fall      Mobility  Bed Mobility Overal bed mobility: Needs Assistance Bed Mobility: Rolling;Supine to  Sit Rolling: Mod assist   Supine to sit: Mod assist     General bed mobility comments: multimodal cues to  roll. Patient sat upright in bed  with supervision. Patient placed legs over edge.  Required  support to place legs back onto bed and tactile cues to lie down. Very fidgety    Transfers                   General transfer comment: Not tested, patient somewhat restless and unable to follow directions,  so remained in bed.    Ambulation/Gait                  Stairs            Wheelchair Mobility    Modified Rankin (Stroke Patients Only)       Balance Overall balance assessment: History of Falls;Needs assistance Sitting-balance support: Feet unsupported;No upper extremity supported   Sitting balance - Comments: patient supports self in sitting with legs over bed edge, constantly pulling on mitts                                     Pertinent Vitals/Pain Breathing: normal Negative Vocalization: none Facial Expression: smiling or inexpressive Body Language: tense, distressed pacing, fidgeting Consolability: distracted or reassured by voice/touch PAINAD Score: 2    Home Living Family/patient expects to be discharged to:: Unsure                   Additional Comments: was home with daughter    Prior Function Prior Level of Function :  History of Falls (last six months)             Mobility Comments: was ambulatory until recent increassed falls ADLs Comments: requires assistance     Hand Dominance        Extremity/Trunk Assessment   Upper Extremity Assessment Upper Extremity Assessment:  (noted strong in grips, trying to pull off mits.)    Lower Extremity Assessment Lower Extremity Assessment:  (lifts legs from bed, raises hips.)    Cervical / Trunk Assessment Cervical / Trunk Assessment: Kyphotic  Communication   Communication:  (occassional" Thank you")  Cognition Arousal/Alertness: Awake/alert Behavior  During Therapy: Restless;Anxious Overall Cognitive Status: History of cognitive impairments - at baseline                                 General Comments: trying to pull mitts off with a vengence. Increased wheezing noted.        General Comments      Exercises     Assessment/Plan    PT Assessment Patient needs continued PT services  PT Problem List Decreased mobility;Decreased safety awareness;Decreased knowledge of precautions;Decreased activity tolerance;Decreased cognition;Decreased knowledge of use of DME       PT Treatment Interventions Therapeutic activities;Cognitive remediation;Gait training;Therapeutic exercise;Patient/family education;Functional mobility training    PT Goals (Current goals can be found in the Care Plan section)  Acute Rehab PT Goals PT Goal Formulation: Patient unable to participate in goal setting Time For Goal Achievement: 11/17/21 Potential to Achieve Goals: Fair    Frequency Min 2X/week   Barriers to discharge Decreased caregiver support      Co-evaluation               AM-PAC PT "6 Clicks" Mobility  Outcome Measure Help needed turning from your back to your side while in a flat bed without using bedrails?: A Lot Help needed moving from lying on your back to sitting on the side of a flat bed without using bedrails?: A Lot Help needed moving to and from a bed to a chair (including a wheelchair)?: Total Help needed standing up from a chair using your arms (e.g., wheelchair or bedside chair)?: Total Help needed to walk in hospital room?: Total Help needed climbing 3-5 steps with a railing? : Total 6 Click Score: 8    End of Session   Activity Tolerance: Patient tolerated treatment well Patient left: in bed;with nursing/sitter in room;with call bell/phone within reach Nurse Communication: Mobility status PT Visit Diagnosis: Unsteadiness on feet (R26.81);History of falling (Z91.81)    Time: 1440-1501 PT Time  Calculation (min) (ACUTE ONLY): 21 min   Charges:   PT Evaluation $PT Eval Low Complexity: [place] PT Acute Rehabilitation Services Pager [phone] Office [phone]   Claretha Cooper 11/03/2021, 3:26 PM

## 2021-11-03 NOTE — Assessment & Plan Note (Signed)
CT showed upper pole of right kidney shows a lobular masslike area extending from the upper pole of the right kidney measuring 4.3 x 4.6 cm. U/s was unremarkable. Can follow-up as an outpt if indicated based on goals of care.

## 2021-11-04 DIAGNOSIS — Z7189 Other specified counseling: Secondary | ICD-10-CM | POA: Diagnosis not present

## 2021-11-04 DIAGNOSIS — N179 Acute kidney failure, unspecified: Secondary | ICD-10-CM | POA: Diagnosis not present

## 2021-11-04 DIAGNOSIS — R627 Adult failure to thrive: Secondary | ICD-10-CM | POA: Diagnosis not present

## 2021-11-04 DIAGNOSIS — G309 Alzheimer's disease, unspecified: Secondary | ICD-10-CM | POA: Diagnosis not present

## 2021-11-04 DIAGNOSIS — E875 Hyperkalemia: Secondary | ICD-10-CM | POA: Diagnosis not present

## 2021-11-04 DIAGNOSIS — Z515 Encounter for palliative care: Secondary | ICD-10-CM

## 2021-11-04 LAB — BASIC METABOLIC PANEL
Anion gap: 14 (ref 5–15)
BUN: 83 mg/dL — ABNORMAL HIGH (ref 8–23)
CO2: 15 mmol/L — ABNORMAL LOW (ref 22–32)
Calcium: 7.6 mg/dL — ABNORMAL LOW (ref 8.9–10.3)
Chloride: 111 mmol/L (ref 98–111)
Creatinine, Ser: 4.46 mg/dL — ABNORMAL HIGH (ref 0.44–1.00)
GFR, Estimated: 9 mL/min — ABNORMAL LOW (ref >60)
Glucose, Bld: 146 mg/dL — ABNORMAL HIGH (ref 70–99)
Potassium: 6.1 mmol/L — ABNORMAL HIGH (ref 3.5–5.1)
Sodium: 140 mmol/L (ref 135–145)

## 2021-11-04 LAB — URINALYSIS, ROUTINE W REFLEX MICROSCOPIC
Bilirubin Urine: NEGATIVE
Glucose, UA: NEGATIVE mg/dL
Ketones, ur: NEGATIVE mg/dL
Leukocytes,Ua: NEGATIVE
Nitrite: NEGATIVE
Protein, ur: >300 mg/dL — AB
Specific Gravity, Urine: >1.03 — ABNORMAL HIGH (ref 1.005–1.030)
pH: 5.5 (ref 5.0–8.0)

## 2021-11-04 LAB — URINALYSIS, MICROSCOPIC (REFLEX)

## 2021-11-04 LAB — GLUCOSE, CAPILLARY
Glucose-Capillary: 154 mg/dL — ABNORMAL HIGH (ref 70–99)
Glucose-Capillary: 106 mg/dL — ABNORMAL HIGH (ref 70–99)
Glucose-Capillary: 88 mg/dL (ref 70–99)
Glucose-Capillary: 103 mg/dL — ABNORMAL HIGH (ref 70–99)

## 2021-11-04 MED ORDER — CHLORHEXIDINE GLUCONATE CLOTH 2 % EX PADS: 6.0000 | MEDICATED_PAD | Freq: Every day | CUTANEOUS | Status: DC

## 2021-11-04 MED ORDER — SODIUM ZIRCONIUM CYCLOSILICATE 10 G PO PACK
10.0000 g | PACK | Freq: Once | ORAL | Status: DC
Start: 2021-11-04 — End: 2021-11-04

## 2021-11-04 MED ORDER — CHLORHEXIDINE GLUCONATE CLOTH 2 % EX PADS
6.0000 | MEDICATED_PAD | Freq: Every day | CUTANEOUS | Status: DC
  Administered 2021-11-04 – 2021-11-11 (×7): 6 via TOPICAL

## 2021-11-04 MED ORDER — CALCIUM GLUCONATE-NACL 1-0.675 GM/50ML-% IV SOLN
1.0000 g | Freq: Once | INTRAVENOUS | Status: AC
  Administered 2021-11-04: 20:00:00 1000 mg via INTRAVENOUS
  Filled 2021-11-04: qty 50

## 2021-11-04 MED ORDER — SODIUM ZIRCONIUM CYCLOSILICATE 10 G PO PACK
10.0000 g | PACK | Freq: Once | ORAL | Status: AC
  Administered 2021-11-04: 08:00:00 10 g via ORAL
  Filled 2021-11-04: qty 1

## 2021-11-04 MED ORDER — SODIUM BICARBONATE 8.4 % IV SOLN
INTRAVENOUS | Status: DC
  Filled 2021-11-04: qty 150
  Filled 2021-11-04: qty 1000
  Filled 2021-11-04 (×2): qty 150

## 2021-11-04 NOTE — Consult Note (Signed)
Consultation Note Date: 11/04/2021   Patient Name: Diana Tran  DOB: 1932-02-12  MRN: [id number]  Age / Sex: 87 y.o., female  PCP: Lin Landsman, MD Referring Physician: Kathie Dike, MD  Reason for Consultation: Establishing goals of care  HPI/Patient Profile: 87 y.o. female  with past medical history of advanced dementia, CKD stage 3b, HTN, HLD, diabetes, depression, glaucoma, incidental 22m pulmonary nodule on CT chest 10/24/21 admitted on 10/26/2021 with frequent falls and worsening mental status. Noted to have new ataxia and recent fall with fracture to orbital floor requiring no intervention - neurology notes that symptoms due to progression of Alzheimer's dementia. Found to have acute on chronic kidney failure with CT showing masslike area of right kidney upper pole area - renal ultrasound 11/03/21 shows cystic changes to kidneys.   Clinical Assessment and Goals of Care: I met today at Diana Tran' bedside along with daughter, Diana Tran. Diana Tran lethargic, confused, and restless in bed. She is not attempting to get out of bed but does turn side to side constantly throughout my visit. I spoke further with Diana Tran about my concern for her mother's kidney function and that this is a result of her poor intake due to advanced and end stage dementia. Diana Tran shares that they have witnessed ongoing decline with many falls over past couple years and significant decline since her last fall 10/24/21. Diana Tran has been trying to care for her at home but this proves to be increasingly difficult. Diana Tran shares that she has been in contact with PCP regarding placement in locked memory care unit. Diana Tran expresses great concern regarding placement from the hospital as she cannot care for her mother at home.   I discussed further with Diana Tran trajectory of dementia and failing kidneys. I worry that her condition may  continue to worsen instead of improve. I acknowledged that I am aware that family spoke about this yesterday. Diana Tran expresses awareness of her mother's poor prognosis but also hope that we can provide some IVF that will aide in some level of improvement and more time. Diana Tran understands the limitations of medicine in her mother's situation. She expresses that family want to have relief knowing that they have tried everything and given Diana Tran every opportunity. I did encourage Diana Tran to speak with her brother regarding DNR as we know that if her mother were to worsen to the point of requiring resuscitative efforts I would not expect her to survive and I worry about the pain and suffering that would cause her. They are not ready to make any decisions at this point. They are open to further conversation. I gave Diana Tran Hard Choices booklet and my contact for any further questions or concerns. I discussed with Diana Tran that we will follow her mother's lead as she will let [place] what her body is able or not able to do but I reassured Diana Tran that we will do our best to care for her mother and ensure we have given her every opportunity to improve if her body  is able to do so. Diana Tran does understand that options are very limited if her mother continues to decline.   All questions/concerns addressed. Emotional support provided. Discussed with Dr. Roderic Palau.   Primary Decision Maker HCPOA adult daughter and son    SUMMARY OF RECOMMENDATIONS   - Strongly recommended more consideration of DNR status at this time while continuing IVF and treatment otherwise.  - Ongoing goals of care conversations.   Code Status/Advance Care Planning: Full code   Symptom Management:  Agitation: Difficult to treat given lethargy from declining kidney function. Could consider mirtazapine 7.5 mg qhs. Avoid sedating medications unless goals change for comfort focused care.   Palliative Prophylaxis:  Aspiration, Delirium  Protocol, Frequent Pain Assessment, Oral Care, and Turn Reposition  Psycho-social/Spiritual:  Desire for further Chaplaincy support:no Additional Recommendations: Education on Hospice and Grief/Bereavement Support  Prognosis:  Overall prognosis poor with end stage dementia and worsening kidney failure.   Discharge Planning: To Be Determined      Primary Diagnoses: Present on Admission:  Acute kidney injury superimposed on chronic kidney disease ([place])  Alzheimer's dementia ([place])  Hyperkalemia  Hypertension associated with diabetes ([place])   I have reviewed the medical record, interviewed the patient and family, and examined the patient. The following aspects are pertinent.  Past Medical History:  Diagnosis Date   Dementia ([place])    Hyperlipidemia associated with type 2 diabetes mellitus (HCC)    Hypertension associated with diabetes ([place])    Stage 3b chronic kidney disease (CKD) ([place])    Type 2 diabetes mellitus ([place])    Social History   Socioeconomic History   Marital status: Single    Spouse name: Not on file   Number of children: 2   Years of education: 12   Highest education level: Not on file  Occupational History   Not on file  Tobacco Use   Smoking status: Never   Smokeless tobacco: Never  Substance and Sexual Activity   Alcohol use: Not on file   Drug use: Not on file   Sexual activity: Not on file  Other Topics Concern   Not on file  Social History Narrative   Right handed   Drinks caffeine prn   Social Determinants of Health   Financial Resource Strain: Not on file  Food Insecurity: Not on file  Transportation Needs: Not on file  Physical Activity: Not on file  Stress: Not on file  Social Connections: Not on file   Family History  Problem Relation Age of Onset   Cancer Mother    Cancer Father    Scheduled Meds:  heparin  5,000 Units Subcutaneous Q8H   insulin aspart  0-9 Units Subcutaneous TID WC   sodium chloride flush  3 mL Intravenous  Q12H   Continuous Infusions:  lactated ringers 150 mL/hr at 11/04/21 0604   PRN Meds:.acetaminophen **OR** acetaminophen, labetalol, LORazepam, senna-docusate Allergies  Allergen Reactions   Amlodipine Swelling and Other (See Comments)    Caused swelling at 10 mg   Galantamine Swelling and Other (See Comments)    Made the feet swell    Memantine Other (See Comments)    Caused extreme lethargy   Review of Systems  Unable to perform ROS: Dementia   Physical Exam Vitals and nursing note reviewed.  Constitutional:      General: She is not in acute distress.    Appearance: She is ill-appearing.  Cardiovascular:     Rate and Rhythm: Normal rate.  Pulmonary:  Effort: No tachypnea, accessory muscle usage or respiratory distress.  Abdominal:     General: Abdomen is flat.     Palpations: Abdomen is soft.  Neurological:     Mental Status: She is confused.    Vital Signs: BP (!) 178/111 (BP Location: Left Arm)    Pulse 78    Temp 98.2 F (36.8 C)    Resp 18    SpO2 99%  Pain Scale: 0-10 POSS *See Group Information*: S-Acceptable,Sleep, easy to arouse Pain Score: 0-No pain   SpO2: SpO2: 99 % O2 Device:SpO2: 99 % O2 Flow Rate: .   IO: Intake/output summary:  Intake/Output Summary (Last 24 hours) at 11/04/2021 0943 Last data filed at 11/04/2021 0600 Gross per 24 hour  Intake 2197.56 ml  Output --  Net 2197.56 ml    LBM: Last BM Date: 11/04/21 Baseline Weight:   Most recent weight:       Palliative Assessment/Data:     Time In: 1100 Time Out: 1200 Time Total: 60 min Greater than 50%  of this time was spent counseling and coordinating care related to the above assessment and plan.  Signed by: Vinie Sill, NP Palliative Medicine Team Pager # [phone] (M-F 8a-5p) Team Phone # [phone] (Nights/Weekends)

## 2021-11-04 NOTE — Progress Notes (Signed)
PROGRESS NOTE    Diana Tran  BTD:[id number] DOB: [birth year] DOA: 11/13/2021 PCP: Lin Landsman, MD    Brief Narrative:  87 year old female with a history of advanced Alzheimer's dementia, chronic kidney disease stage IV, admitted to the hospital with failure to thrive and worsening renal failure.  She has worsening metabolic encephalopathy, and renal function has not improved despite IV fluids.  Palliative care following for goals of care.   Assessment & Plan:   Principal Problem:   Acute kidney injury superimposed on chronic kidney disease ([place]) Active Problems:   Type 2 diabetes mellitus ([place])   Hypertension associated with diabetes ([place])   Alzheimer's dementia ([place])   Hyperkalemia   Prolonged QT interval   Abnormal chest x-ray   Pulmonary nodule   Kidney mass   Adult failure to thrive   CKD (chronic kidney disease), stage IV (HCC)   Acute kidney injury on chronic kidney disease stage IV -Baseline creatinine appears to be 2.3 -She was admitted with creatinine of 4.74 -Creatinine has only made modest improvement since admission down to 4.4 -Concerning that BUN continues to trend up at 83 -She has been on IV fluids -Output has not been accurately recorded -Imaging did not indicate any hydronephrosis -We will place Foley catheter for more accurate output, since she did not tolerate pelvic and constantly removed -Continue on IV hydration  Hyperkalemia -Related to renal failure -Potassium up to 6.1 today -We will give a dose of calcium gluconate for cardioprotective -Check EKG for T waves -Limited management since she is not able to take p.o. -She is been started on bicarbonate infusion which should hopefully help -Recheck in a.m.  Acute metabolic encephalopathy, superimposed on Alzheimer's dementia -Patient appears to have worsening encephalopathy -Was reportedly more awake yesterday, today she is extremely lethargic -Suspect that her worsening uremia is likely  contributing -CT head on admission was unremarkable  Diabetes -Holding metformin -A1c of 7.9 -Currently on sliding scale  Pulmonary nodule -Can consider noncontrast chest CT in 3 to 6 months if further surveillance desired  Kidney mass -CT abdomen showed lobular masslike area extending from interpolar right kidney -This can also be followed up with further imaging as an outpatient if desired  Hypertension -Can consider to resume carvedilol if mental status improves  Adult failure to thrive -Daughter reports that she had decreased p.o. intake for the past 2 weeks -Appears to be have precipitated by recent fall with orbital fracture  Goals of care -Palliative care following -Currently full code -I had an honest discussion with the patient's daughter and her son over the phone -Explained my concerns regarding worsening mental status as well as lack of significant improvement with renal failure despite IV fluids -Does not appear to be a candidate for dialysis -I strongly urged them to consider transitioning CODE STATUS from full code to DNR -Further discussions around goals of care will need to occur over the next few days, depending on progress      DVT prophylaxis: heparin injection 5,000 Units Start: 11/03/21 0600  Code Status: Full code Family Communication: Discussed with patient's son and daughter over the phone Disposition Plan: Status is: Inpatient  Remains inpatient appropriate because: Worsening renal failure         Consultants:  Palliative care  Procedures:    Antimicrobials:      Subjective: Patient is somnolent, does not respond to voice.  I cannot get her to open her eyes.  Objective: Vitals:   11/03/21 0740 11/03/21 1352 11/03/21 2054  11/04/21 0455  BP: (!) 168/130 (!) 144/109 (!) 172/92 (!) 178/111  Pulse: 83 83 79 78  Resp: 18 18 16 18   Temp: 97.7 F (36.5 C) 97.7 F (36.5 C) 97.8 F (36.6 C) 98.2 F (36.8 C)  TempSrc: Oral Oral  Oral   SpO2: 100% 98% 99% 99%    Intake/Output Summary (Last 24 hours) at 11/04/2021 2036 Last data filed at 11/04/2021 1748 Gross per 24 hour  Intake 2177.56 ml  Output 600 ml  Net 1577.56 ml   There were no vitals filed for this visit.  Examination:  General exam: Somnolent Respiratory system: Clear to auscultation. Respiratory effort normal. Cardiovascular system: S1 & S2 heard, RRR. No JVD, murmurs, rubs, gallops or clicks. No pedal edema. Gastrointestinal system: Abdomen is nondistended, soft and nontender. No organomegaly or masses felt. Normal bowel sounds heard. Central nervous system: Difficult exam since she does not follow commands Extremities: Symmetric  Skin: No rashes, lesions or ulcers Psychiatry: Somnolent    Data Reviewed: I have personally reviewed following labs and imaging studies  CBC: Recent Labs  Lab 10/29/2021 1824 11/03/21 0415  WBC 9.4 10.0  NEUTROABS 8.4*  --   HGB 9.1* 8.4*  HCT 29.0* 26.8*  MCV 87.6 87.3  PLT 366 169   Basic Metabolic Panel: Recent Labs  Lab 11/01/2021 1824 11/03/21 0415 11/04/21 0403  NA 140 138 140  K 5.5* 5.4* 6.1*  CL 110 112* 111  CO2 20* 15* 15*  GLUCOSE 185* 212* 146*  BUN 79* 72* 83*  CREATININE 4.74* 4.30* 4.46*  CALCIUM 7.9* 7.2* 7.6*   GFR: Estimated Creatinine Clearance: 6.4 mL/min (A) (by C-G formula based on SCr of 4.46 mg/dL (H)). Liver Function Tests: No results for input(s): AST, ALT, ALKPHOS, BILITOT, PROT, ALBUMIN in the last 168 hours. No results for input(s): LIPASE, AMYLASE in the last 168 hours. No results for input(s): AMMONIA in the last 168 hours. Coagulation Profile: No results for input(s): INR, PROTIME in the last 168 hours. Cardiac Enzymes: No results for input(s): CKTOTAL, CKMB, CKMBINDEX, TROPONINI in the last 168 hours. BNP (last 3 results) No results for input(s): PROBNP in the last 8760 hours. HbA1C: Recent Labs    11/03/21 0415  HGBA1C 7.9*   CBG: Recent Labs  Lab  11/03/21 1710 11/03/21 2050 11/04/21 0738 11/04/21 1112 11/04/21 1624  GLUCAP 144* 142* 154* 106* 88   Lipid Profile: No results for input(s): CHOL, HDL, LDLCALC, TRIG, CHOLHDL, LDLDIRECT in the last 72 hours. Thyroid Function Tests: No results for input(s): TSH, T4TOTAL, FREET4, T3FREE, THYROIDAB in the last 72 hours. Anemia Panel: No results for input(s): VITAMINB12, FOLATE, FERRITIN, TIBC, IRON, RETICCTPCT in the last 72 hours. Sepsis Labs: No results for input(s): PROCALCITON, LATICACIDVEN in the last 168 hours.  Recent Results (from the past 240 hour(s))  Resp Panel by RT-PCR (Flu A&B, Covid) Nasopharyngeal Swab     Status: None   Collection Time: 11/10/2021  6:25 PM   Specimen: Nasopharyngeal Swab; Nasopharyngeal(NP) swabs in vial transport medium  Result Value Ref Range Status   SARS Coronavirus 2 by RT PCR NEGATIVE NEGATIVE Final    Comment: (NOTE) SARS-CoV-2 target nucleic acids are NOT DETECTED.  The SARS-CoV-2 RNA is generally detectable in upper respiratory specimens during the acute phase of infection. The lowest concentration of SARS-CoV-2 viral copies this assay can detect is 138 copies/mL. A negative result does not preclude SARS-Cov-2 infection and should not be used as the sole basis for treatment or other patient  management decisions. A negative result may occur with  improper specimen collection/handling, submission of specimen other than nasopharyngeal swab, presence of viral mutation(s) within the areas targeted by this assay, and inadequate number of viral copies(<138 copies/mL). A negative result must be combined with clinical observations, patient history, and epidemiological information. The expected result is Negative.  Fact Sheet for Patients:  EntrepreneurPulse.com.au  Fact Sheet for Healthcare Providers:  IncredibleEmployment.be  This test is no t yet approved or cleared by the [place] FDA and  has been  authorized for detection and/or diagnosis of SARS-CoV-2 by FDA under an Emergency Use Authorization (EUA). This EUA will remain  in effect (meaning this test can be used) for the duration of the COVID-19 declaration under Section 564(b)(1) of the Act, 21 U.S.C.section 360bbb-3(b)(1), unless the authorization is terminated  or revoked sooner.       Influenza A by PCR NEGATIVE NEGATIVE Final   Influenza B by PCR NEGATIVE NEGATIVE Final    Comment: (NOTE) The Xpert Xpress SARS-CoV-2/FLU/RSV plus assay is intended as an aid in the diagnosis of influenza from Nasopharyngeal swab specimens and should not be used as a sole basis for treatment. Nasal washings and aspirates are unacceptable for Xpert Xpress SARS-CoV-2/FLU/RSV testing.  Fact Sheet for Patients: EntrepreneurPulse.com.au  Fact Sheet for Healthcare Providers: IncredibleEmployment.be  This test is not yet approved or cleared by the [place] FDA and has been authorized for detection and/or diagnosis of SARS-CoV-2 by FDA under an Emergency Use Authorization (EUA). This EUA will remain in effect (meaning this test can be used) for the duration of the COVID-19 declaration under Section 564(b)(1) of the Act, 21 U.S.C. section 360bbb-3(b)(1), unless the authorization is terminated or revoked.  Performed at [facility], [place] [address], [place], [place] [address]          Radiology Studies: US RENAL  Result Date: 11/03/2021 CLINICAL DATA:  Acute kidney injury EXAM: RENAL / URINARY TRACT ULTRASOUND COMPLETE COMPARISON:  None. FINDINGS: Right Kidney: Polycystic kidney with superimposed echogenic cortex. Renal length measures up to 13.6 cm due to cystic change. No hydronephrosis. No detected solid mass Left Kidney: Polycystic kidney with superimposed echogenic cortex. Renal length measures up to 15.5 cm due to cystic change. No hydronephrosis. No detected solid mass  Bladder: Not visualized. IMPRESSION: Polycystic kidneys with underlying medical renal disease. No hydronephrosis or asymmetry. Electronically Signed   By: Jorje Guild M.D.   On: 11/03/2021 08:21        Scheduled Meds:  Chlorhexidine Gluconate Cloth  6 each Topical Daily   heparin  5,000 Units Subcutaneous Q8H   insulin aspart  0-9 Units Subcutaneous TID WC   sodium chloride flush  3 mL Intravenous Q12H   Continuous Infusions:  calcium gluconate 1,000 mg (11/04/21 1941)   sodium bicarbonate 150 mEq in D5W infusion 100 mL/hr at 11/04/21 1643     LOS: 2 days    Time spent: 67mins    Kathie Dike, MD [facility]   If 7PM-7AM, please contact night-coverage [email or web address]  11/04/2021, 8:36 PM

## 2021-11-04 NOTE — TOC Progression Note (Addendum)
Transition of Care [facility]) - Progression Note    Patient Details  Name: Diana Tran MRN: [id number] Date of Birth: [birth year]  [facility] CM/SW Contact  Purcell Mouton, RN Phone Number: 11/04/2021, 1:22 PM  Clinical Narrative:    Spoke with pt's daughter concerning SNF. Explained that we only do short term placement. Only one bed was offered to pt. [facility].  Daughter asked for [facility]. AC with [facility] was called, can not take [facility].      Barriers to Discharge: No Barriers Identified  Expected Discharge Plan and Services                                                 Social Determinants of Health (SDOH) Interventions    Readmission Risk Interventions No flowsheet data found.

## 2021-11-04 NOTE — Plan of Care (Signed)
  Problem: Clinical Measurements: Goal: Diagnostic test results will improve Outcome: Progressing   Problem: Nutrition: Goal: Adequate nutrition will be maintained Outcome: Progressing   

## 2021-11-05 DIAGNOSIS — E875 Hyperkalemia: Secondary | ICD-10-CM

## 2021-11-05 DIAGNOSIS — N2889 Other specified disorders of kidney and ureter: Secondary | ICD-10-CM

## 2021-11-05 DIAGNOSIS — G309 Alzheimer's disease, unspecified: Secondary | ICD-10-CM

## 2021-11-05 DIAGNOSIS — Z7189 Other specified counseling: Secondary | ICD-10-CM | POA: Diagnosis not present

## 2021-11-05 DIAGNOSIS — R627 Adult failure to thrive: Secondary | ICD-10-CM

## 2021-11-05 DIAGNOSIS — N184 Chronic kidney disease, stage 4 (severe): Secondary | ICD-10-CM

## 2021-11-05 DIAGNOSIS — F02C0 Dementia in other diseases classified elsewhere, severe, without behavioral disturbance, psychotic disturbance, mood disturbance, and anxiety: Secondary | ICD-10-CM

## 2021-11-05 DIAGNOSIS — Z515 Encounter for palliative care: Secondary | ICD-10-CM | POA: Diagnosis not present

## 2021-11-05 DIAGNOSIS — I152 Hypertension secondary to endocrine disorders: Secondary | ICD-10-CM

## 2021-11-05 DIAGNOSIS — E1159 Type 2 diabetes mellitus with other circulatory complications: Secondary | ICD-10-CM

## 2021-11-05 DIAGNOSIS — N179 Acute kidney failure, unspecified: Secondary | ICD-10-CM | POA: Diagnosis not present

## 2021-11-05 LAB — CBC
HCT: 27.2 % — ABNORMAL LOW (ref 36.0–46.0)
Hemoglobin: 8.8 g/dL — ABNORMAL LOW (ref 12.0–15.0)
MCH: 27.2 pg (ref 26.0–34.0)
MCHC: 32.4 g/dL (ref 30.0–36.0)
MCV: 84 fL (ref 80.0–100.0)
Platelets: 236 10*3/uL (ref 150–400)
RBC: 3.24 MIL/uL — ABNORMAL LOW (ref 3.87–5.11)
RDW: 15.6 % — ABNORMAL HIGH (ref 11.5–15.5)
WBC: 12.1 10*3/uL — ABNORMAL HIGH (ref 4.0–10.5)
nRBC: 4 % — ABNORMAL HIGH (ref 0.0–0.2)

## 2021-11-05 LAB — GLUCOSE, CAPILLARY
Glucose-Capillary: 166 mg/dL — ABNORMAL HIGH (ref 70–99)
Glucose-Capillary: 169 mg/dL — ABNORMAL HIGH (ref 70–99)
Glucose-Capillary: 118 mg/dL — ABNORMAL HIGH (ref 70–99)
Glucose-Capillary: 139 mg/dL — ABNORMAL HIGH (ref 70–99)

## 2021-11-05 LAB — BASIC METABOLIC PANEL
Anion gap: 15 (ref 5–15)
BUN: 94 mg/dL — ABNORMAL HIGH (ref 8–23)
CO2: 18 mmol/L — ABNORMAL LOW (ref 22–32)
Calcium: 7.9 mg/dL — ABNORMAL LOW (ref 8.9–10.3)
Chloride: 107 mmol/L (ref 98–111)
Creatinine, Ser: 4.39 mg/dL — ABNORMAL HIGH (ref 0.44–1.00)
GFR, Estimated: 9 mL/min — ABNORMAL LOW (ref >60)
Glucose, Bld: 160 mg/dL — ABNORMAL HIGH (ref 70–99)
Potassium: 6 mmol/L — ABNORMAL HIGH (ref 3.5–5.1)
Sodium: 140 mmol/L (ref 135–145)

## 2021-11-05 MED ORDER — FUROSEMIDE 10 MG/ML IJ SOLN
80.0000 mg | Freq: Once | INTRAMUSCULAR | Status: AC
  Administered 2021-11-05: 80 mg via INTRAVENOUS
  Filled 2021-11-05: qty 8

## 2021-11-05 MED ORDER — SODIUM ZIRCONIUM CYCLOSILICATE 10 G PO PACK: 10.0000 g | PACK | Freq: Three times a day (TID) | ORAL | Status: AC

## 2021-11-05 NOTE — Plan of Care (Signed)
?  Problem: Clinical Measurements: ?Goal: Will remain free from infection ?Outcome: Progressing ?  ?Problem: Clinical Measurements: ?Goal: Diagnostic test results will improve ?Outcome: Progressing ?  ?

## 2021-11-05 NOTE — Progress Notes (Signed)
I spoke over the phone with patient's daughter and son.  We talked about Mrs Gowans current condition, of worsening renal failure, hyperkalemia, advanced dementia, minimally responsiveness, not interactive, non ambulatory and no oral intake.   Limited therapeutic options because unable to administer po medications due to risk of aspiration.  High risk for worsening condition including cardiac arrest.   They agree that patient will not benefit from renal replacement therapy, considering her advanced dementia and poor quality of life with very poor prognosis.  I explained the very low probability of successful resuscitation in case of cardiac arrest, possible consequences of resuscitation attempts including chest trauma, anoxic brain injury, and prolonging suffering.  They will continue to talk about this issue, but for now they want to continue full code.    All questions were addressed.  Palliative care has been consulted.

## 2021-11-05 NOTE — Progress Notes (Signed)
Patient taking off tele monitoring multiple times today.

## 2021-11-05 NOTE — Progress Notes (Signed)
PROGRESS NOTE    ELNORIA LIVINGSTON  ZLD:[id number] DOB: [birth year] DOA: 11/20/2021 PCP: Lin Landsman, MD    Brief Narrative:  Mrs. Sliva was admitted to the hospital with the working diagnosis of AKI on CKD stage IV with hyperkalemia.   87 yo female with the past medical history of advanced dementia chronic kidney disease stage IIIb, hypothyroidism, hypertension, dyslipidemia and depression who presented worsening worsening symptoms. Multiple emergency for last 6 months, 12/03, mechanical fall with left orbital floor fracture.12/07 ataxia.  Currently living in memory.  She was unable to give detailed history due to her cognitive impairment.  Blood pressure on admission 195/117, heart rate 75, temperature 97.0, oxygen saturation 97%, respiratory rate 18.  Patient was hypersomnolent, lungs clear to auscultation bilaterally, heart S1-S2, present, rhythmic, abdomen soft no lower extremity edema.  Sodium 140, potassium 5.5, chloride 110, bicarb 20, glucose 185, BUN 79, creatinine 4.74 White count 9.4, globin 9.1, hematocrit 39.0, platelets 366. SARS COVID-19 negative.  Urine analysis specific gravity > 1.030, > 300 protein, 0 To 5 white cells.  Chest radiograph with right rotation.  No frank infiltrates, personally reviewed.   EKG 80 bpm, left axis deviation, left anterior fascicular block, right bundle branch block, qtc 535, sinus rhythm, no significant ST segment or T wave changes, positive LVH.   Assessment & Plan:   Principal Problem:   Acute kidney injury superimposed on chronic kidney disease ([place]) Active Problems:   Type 2 diabetes mellitus ([place])   Hypertension associated with diabetes ([place])   Alzheimer's dementia ([place])   Hyperkalemia   Prolonged QT interval   Abnormal chest x-ray   Pulmonary nodule   Kidney mass   Adult failure to thrive   CKD (chronic kidney disease), stage IV (HCC)   AKI on CKD stage 4/ hyperkalemia/ anion gap metabolic acidosis. Renal function with  serum cr at 4,39 with K at 6,0 and serum bicarbonate at 18. Patient with trace edema lower extremities and mild expiratory wheezing.   Not able to take po medications due to advance dementia. (Not given sodium zirconium)   Plan to decrease bicarb drip to 50 ml per hr and add furosemide 80 mg Iv x1.  Follow up renal function in am. Considering her advance dementia, non verbal and non ambulatory she is not candidate for renal replacement therapy.   2. Advanced dementia with delirium. Her cognitive impairment is very severe, she is not interactive and non verbal.  Not eating or taking oral medications. Has mittens in place.  Continue neuro checks and fall precautions.  Poor prognosis.  As needed lorazepam.   3. Reactive leukocytosis, anemia of chronic renal disease. Wbc is 12.1,. hgb is 8,8 and hct is 27. Continue close monitoring. No indication for PRBC transfusion   4. T2DM her fasting glucose is 160 mg/dl.  Continue glucose monitoring.   5. HTN Blood pressure has been stable, 903 mmHg systolic    Patient continue to be at high risk for worsening renal failure   Status is: Inpatient  Remains inpatient appropriate because: IV fluids        DVT prophylaxis:  Heparin   Code Status:    full  Family Communication:  No family at the bedside      Subjective: Patient not interactive, non verbal and non responsive   Objective: Vitals:   11/03/21 2054 11/04/21 0455 11/04/21 2034 11/05/21 0429  BP: (!) 172/92 (!) 178/111 (!) 160/103 (!) 162/87  Pulse: 79 78 84 84  Resp: 16 18  Temp: 97.8 F (36.6 C) 98.2 F (36.8 C) 98.6 F (37 C) 97.9 F (36.6 C)  TempSrc: Oral  Oral Oral  SpO2: 99% 99% 99% 99%    Intake/Output Summary (Last 24 hours) at 11/05/2021 1446 Last data filed at 11/05/2021 1100 Gross per 24 hour  Intake 968.33 ml  Output 950 ml  Net 18.33 ml   There were no vitals filed for this visit.  Examination:   General: deconditioned, ill looking  appearing, lying in bed transverse to the longitudinal access  Neurology: Non verbal and not responsive  E ENT: no pallor, no icterus, oral mucosa moist Cardiovascular: No JVD. S1-S2 present, rhythmic, no gallops, rubs, or murmurs. No lower extremity edema. Pulmonary: positive breath sounds bilaterally, positive expiratory wheezing, with no rhonchi or rales. Gastrointestinal. Abdomen soft and non tender Skin. No rashes Musculoskeletal: no joint deformities     Data Reviewed: I have personally reviewed following labs and imaging studies  CBC: Recent Labs  Lab 11/04/2021 1824 11/03/21 0415 11/05/21 0356  WBC 9.4 10.0 12.1*  NEUTROABS 8.4*  --   --   HGB 9.1* 8.4* 8.8*  HCT 29.0* 26.8* 27.2*  MCV 87.6 87.3 84.0  PLT 366 331 591   Basic Metabolic Panel: Recent Labs  Lab 11/11/2021 1824 11/03/21 0415 11/04/21 0403 11/05/21 0356  NA 140 138 140 140  K 5.5* 5.4* 6.1* 6.0*  CL 110 112* 111 107  CO2 20* 15* 15* 18*  GLUCOSE 185* 212* 146* 160*  BUN 79* 72* 83* 94*  CREATININE 4.74* 4.30* 4.46* 4.39*  CALCIUM 7.9* 7.2* 7.6* 7.9*   GFR: Estimated Creatinine Clearance: 6.5 mL/min (A) (by C-G formula based on SCr of 4.39 mg/dL (H)). Liver Function Tests: No results for input(s): AST, ALT, ALKPHOS, BILITOT, PROT, ALBUMIN in the last 168 hours. No results for input(s): LIPASE, AMYLASE in the last 168 hours. No results for input(s): AMMONIA in the last 168 hours. Coagulation Profile: No results for input(s): INR, PROTIME in the last 168 hours. Cardiac Enzymes: No results for input(s): CKTOTAL, CKMB, CKMBINDEX, TROPONINI in the last 168 hours. BNP (last 3 results) No results for input(s): PROBNP in the last 8760 hours. HbA1C: Recent Labs    11/03/21 0415  HGBA1C 7.9*   CBG: Recent Labs  Lab 11/04/21 1112 11/04/21 1624 11/04/21 2038 11/05/21 0733 11/05/21 1112  GLUCAP 106* 88 103* 166* 169*   Lipid Profile: No results for input(s): CHOL, HDL, LDLCALC, TRIG, CHOLHDL,  LDLDIRECT in the last 72 hours. Thyroid Function Tests: No results for input(s): TSH, T4TOTAL, FREET4, T3FREE, THYROIDAB in the last 72 hours. Anemia Panel: No results for input(s): VITAMINB12, FOLATE, FERRITIN, TIBC, IRON, RETICCTPCT in the last 72 hours.    Radiology Studies: I have reviewed all of the imaging during this hospital visit personally     Scheduled Meds:  Chlorhexidine Gluconate Cloth  6 each Topical Daily   heparin  5,000 Units Subcutaneous Q8H   insulin aspart  0-9 Units Subcutaneous TID WC   sodium chloride flush  3 mL Intravenous Q12H   sodium zirconium cyclosilicate  10 g Oral TID   Continuous Infusions:  sodium bicarbonate 150 mEq in D5W infusion 100 mL/hr at 11/05/21 0659     LOS: 3 days        Aniaya Bacha Gerome Apley, MD

## 2021-11-05 NOTE — Progress Notes (Signed)
Palliative:  HPI: 87 y.o. female  with past medical history of advanced dementia, CKD stage 3b, HTN, HLD, diabetes, depression, glaucoma, incidental 57m pulmonary nodule on CT chest 10/24/21 admitted on 10/28/2021 with frequent falls and worsening mental status. Noted to have new ataxia and recent fall with fracture to orbital floor requiring no intervention - neurology notes that symptoms due to progression of Alzheimer's dementia. Found to have acute on chronic kidney failure with CT showing masslike area of right kidney upper pole area - renal ultrasound 11/03/21 shows cystic changes to kidneys.    I met today with Diana Tran but no family/visitors at bedside. She is less responsive to me today compared to yesterday. She has eyes slightly open but does not track. She does not have any verbal response. NT reports that she has been unable to get her to tolerate any intake despite multiple attempts. NT reports that only response she received today was when Diana Tran stated "no" when she was attempted to feed her lunch.   I called and spoke with daughter, Diana Tran who I spoke with yesterday at bedside. We discussed kidney function and her mother's status today. Discussed there is no improvements and she continues with poor prognosis. We discussed hypokalemia and how this effects the heart. I again strongly encouraged them to consider DNR status knowing this will only be harmful to Diana Tran. [name] acknowledges understanding and reports that she and her brother are trying to come to an agreement. They are awaiting call from Dr. ACathlean Saueras they have been missing calls back and forth today so far. I will follow up again tomorrow.   All questions/concerns addressed. Emotional support provided.   Exam: Confused. No tracking, not verbal. Does not follow any commands. No distress. Breathing regular, unlabored. Abd soft. Moves all extremities.   Plan: - Ongoing goals of care conversations.  - Very poor  prognosis. Strongly encouraged DNR status.   2[place] NP Palliative Medicine Team Pager [phone](Please see [email or web address] for schedule) Team Phone [phone]   Greater than 50%  of this time was spent counseling and coordinating care related to the above assessment and plan

## 2021-11-05 NOTE — Progress Notes (Signed)
Patient pulled out IV line - IV team here to restart - she is unable to get an IV since patient is agitated and moving and will not let her start IV.

## 2021-11-06 DIAGNOSIS — F02C11 Dementia in other diseases classified elsewhere, severe, with agitation: Secondary | ICD-10-CM

## 2021-11-06 DIAGNOSIS — R9389 Abnormal findings on diagnostic imaging of other specified body structures: Secondary | ICD-10-CM | POA: Diagnosis not present

## 2021-11-06 DIAGNOSIS — Z515 Encounter for palliative care: Secondary | ICD-10-CM | POA: Diagnosis not present

## 2021-11-06 DIAGNOSIS — E875 Hyperkalemia: Secondary | ICD-10-CM | POA: Diagnosis not present

## 2021-11-06 DIAGNOSIS — G309 Alzheimer's disease, unspecified: Secondary | ICD-10-CM | POA: Diagnosis not present

## 2021-11-06 DIAGNOSIS — N179 Acute kidney failure, unspecified: Secondary | ICD-10-CM | POA: Diagnosis not present

## 2021-11-06 DIAGNOSIS — Z7189 Other specified counseling: Secondary | ICD-10-CM | POA: Diagnosis not present

## 2021-11-06 LAB — BASIC METABOLIC PANEL
Anion gap: 17 — ABNORMAL HIGH (ref 5–15)
BUN: 104 mg/dL — ABNORMAL HIGH (ref 8–23)
CO2: 19 mmol/L — ABNORMAL LOW (ref 22–32)
Calcium: 7.5 mg/dL — ABNORMAL LOW (ref 8.9–10.3)
Chloride: 104 mmol/L (ref 98–111)
Creatinine, Ser: 4.97 mg/dL — ABNORMAL HIGH (ref 0.44–1.00)
GFR, Estimated: 8 mL/min — ABNORMAL LOW (ref >60)
Glucose, Bld: 170 mg/dL — ABNORMAL HIGH (ref 70–99)
Potassium: 5.4 mmol/L — ABNORMAL HIGH (ref 3.5–5.1)
Sodium: 140 mmol/L (ref 135–145)

## 2021-11-06 LAB — GLUCOSE, CAPILLARY
Glucose-Capillary: 171 mg/dL — ABNORMAL HIGH (ref 70–99)
Glucose-Capillary: 166 mg/dL — ABNORMAL HIGH (ref 70–99)

## 2021-11-06 NOTE — Progress Notes (Signed)
Palliative:  HPI: 87 y.o. female  with past medical history of advanced dementia, CKD stage 3b, HTN, HLD, diabetes, depression, glaucoma, incidental 75m pulmonary nodule on CT chest 10/24/21 admitted on 11/20/2021 with frequent falls and worsening mental status. Noted to have new ataxia and recent fall with fracture to orbital floor requiring no intervention - neurology notes that symptoms due to progression of Alzheimer's dementia. Found to have acute on chronic kidney failure with CT showing masslike area of right kidney upper pole area - renal ultrasound 11/03/21 shows cystic changes to kidneys.     I met today with Diana Tran but she is not responsive and no family at bedside. I called and spoke with daughter, Diana Tran Diana Stadeshares with me that she and Diana brother have had extensive discussions regarding code status and they have not been able to come to common conclusion. She shares that they spoke with their father (noted to have early dementia) and that the decision was made to remain full code and continue to do everything possible. I expressed understanding of their opinion and Diana Tran's difficult decision but also expressed that we are doing everything possible but providing resuscitation efforts would only be causing harm and pain to Diana Tran. I explained that Diana Tran is dying and I would hate to see Diana go through these interventions that we know will not be beneficial to Diana. I recommended that family come to visit and see Diana Tran' status themselves. Diana Tran understands but notes that Diana family has made their decision and she does not feel that anything will change their mind. Emotional support provided.   Unfortunately this is a difficult situation. I do not feel that resuscitative efforts would be in [name] best interest. I do feel that this intervention would be futile and cause harm and therefore should not be offered. Reviewed Advanced Directive but this directive leaves all  decisions to HCPOA which is children.   Discussed difficult situation with Dr. ACathlean Tran   Exam: Unresponsive. Continues to be restless in bed and moves but not to command. Not verbal. Breathing regular, unlabored. Abd soft. Moves all extremities. Lies in fetal position.   Plan: - Difficult situation and family not willing to consider DNR.   2Iraan NP Palliative Medicine Team Pager [phone](Please see [email or web address] for schedule) Team Phone [phone]   Greater than 50%  of this time was spent counseling and coordinating care related to the above assessment and plan

## 2021-11-06 NOTE — Progress Notes (Signed)
Physical Therapy Treatment Patient Details Name: Diana Tran MRN: [id number] DOB: July 15, 1932 Today's Date: 11/06/2021   History of Present Illness 87 year old female comnes to Ed 11/03/2021 with falls, worsened  mental status.  Patient has history of CKD, diabetes and dementia., daughtrer has cared for patient since 12/3 after ED visit. Has been in memory care, needing higher level of care.    PT Comments    Pt fidgeting in bed, attempting to remove mitts. Assisted pt to sit at edge of bed for ~5 minutes. She performed UE/LE AAROM exercises. Pt then stood edge of bed for ~1 minute with hand held assist. She is not able to follow commands in order to safely ambulate.      Recommendations for follow up therapy are one component of a multi-disciplinary discharge planning process, led by the attending physician.  Recommendations may be updated based on patient status, additional functional criteria and insurance authorization.  Follow Up Recommendations  Long-term institutional care without follow-up therapy     Assistance Recommended at Discharge Frequent or constant Supervision/Assistance  Equipment Recommendations  None recommended by PT    Recommendations for Other Services       Precautions / Restrictions Precautions Precautions: Fall Restrictions Weight Bearing Restrictions: No     Mobility  Bed Mobility Overal bed mobility: Needs Assistance Bed Mobility: Rolling;Sidelying to Sit Rolling: Mod assist Sidelying to sit: Mod assist       General bed mobility comments: pt unable to follow commands, manual cues to initiate movement; pt sat edge of bed x 5 minutes    Transfers Overall transfer level: Needs assistance Equipment used: 1 person hand held assist Transfers: Sit to/from Stand Sit to Stand: Min assist           General transfer comment: pt stood edge of bed for ~45 seconds with hand held assist of 1, could not follow commands to march in place     Ambulation/Gait                   Stairs             Wheelchair Mobility    Modified Rankin (Stroke Patients Only)       Balance Overall balance assessment: History of Falls;Needs assistance Sitting-balance support: No upper extremity supported;Feet supported Sitting balance-Leahy Scale: Fair     Standing balance support: Single extremity supported Standing balance-Leahy Scale: Poor                              Cognition Arousal/Alertness: Awake/alert Behavior During Therapy: Restless;Anxious Overall Cognitive Status: History of cognitive impairments - at baseline                                 General Comments: trying to pull mitts off with a vengence. Increased wheezing noted.        Exercises General Exercises - Upper Extremity Shoulder Flexion: AAROM;Both;20 reps;Seated General Exercises - Lower Extremity Long Arc Quad: AAROM;Both;20 reps;Seated    General Comments        Pertinent Vitals/Pain Breathing: normal Negative Vocalization: none Facial Expression: smiling or inexpressive Body Language: tense, distressed pacing, fidgeting Consolability: no need to console PAINAD Score: 1    Home Living                          Prior  Function            PT Goals (current goals can now be found in the care plan section) Acute Rehab PT Goals PT Goal Formulation: Patient unable to participate in goal setting Time For Goal Achievement: 11/17/21 Potential to Achieve Goals: Fair Progress towards PT goals: Progressing toward goals    Frequency    Min 2X/week      PT Plan Current plan remains appropriate    Co-evaluation              AM-PAC PT "6 Clicks" Mobility   Outcome Measure  Help needed turning from your back to your side while in a flat bed without using bedrails?: A Lot Help needed moving from lying on your back to sitting on the side of a flat bed without using bedrails?: A  Lot Help needed moving to and from a bed to a chair (including a wheelchair)?: A Lot Help needed standing up from a chair using your arms (e.g., wheelchair or bedside chair)?: A Lot Help needed to walk in hospital room?: Total Help needed climbing 3-5 steps with a railing? : Total 6 Click Score: 10    End of Session Equipment Utilized During Treatment: Gait belt Activity Tolerance: Patient tolerated treatment well Patient left: in bed;with call bell/phone within reach;with bed alarm set Nurse Communication: Mobility status PT Visit Diagnosis: Unsteadiness on feet (R26.81);History of falling (Z91.81)     Time: 9458-5929 PT Time Calculation (min) (ACUTE ONLY): 8 min  Charges:  $Therapeutic Activity: 8-22 mins                     Blondell Reveal Kistler PT 11/06/2021  Acute Rehabilitation Services Pager [phone] Office [phone]

## 2021-11-06 NOTE — Care Management Important Message (Signed)
Important Message  Patient Details IM Letter placed in Patients room. Name: Diana Tran MRN: [id number] Date of Birth: 11-Oct-1932   Medicare Important Message Given:  Yes     Kerin Salen 11/06/2021, 11:49 AM

## 2021-11-06 NOTE — Progress Notes (Addendum)
PROGRESS NOTE    Diana Tran  FIE:[id number] DOB: 08-07-1932 DOA: 11/16/2021 PCP: Lin Landsman, MD    Brief Narrative:  Diana Tran was admitted to the hospital with the working diagnosis of AKI on CKD stage IV with hyperkalemia.  Advance dementia with severe delirium.    87 yo female with the past medical history of advanced dementia chronic kidney disease stage IIIb, hypothyroidism, hypertension, dyslipidemia and depression who presented worsening worsening symptoms. Multiple emergency for last 6 months, 12/03, mechanical fall with left orbital floor fracture.12/07 ataxia.  Currently living in memory.  She was unable to give detailed history due to her cognitive impairment.  Blood pressure on admission 195/117, heart rate 75, temperature 97.0, oxygen saturation 97%, respiratory rate 18.  Patient was hypersomnolent, lungs clear to auscultation bilaterally, heart S1-S2, present, rhythmic, abdomen soft no lower extremity edema.   Sodium 140, potassium 5.5, chloride 110, bicarb 20, glucose 185, BUN 79, creatinine 4.74 White count 9.4, globin 9.1, hematocrit 39.0, platelets 366. SARS COVID-19 negative.   Urine analysis specific gravity > 1.030, > 300 protein, 0 To 5 white cells.   Chest radiograph with right rotation.  No frank infiltrates, personally reviewed.    EKG 80 bpm, left axis deviation, left anterior fascicular block, right bundle branch block, qtc 535, sinus rhythm, no significant ST segment or T wave changes, positive LVH.    Assessment & Plan:   Principal Problem:   Acute kidney injury superimposed on chronic kidney disease ([place]) Active Problems:   Type 2 diabetes mellitus ([place])   Hypertension associated with diabetes ([place])   Alzheimer's dementia ([place])   Hyperkalemia   Prolonged QT interval   Abnormal chest x-ray   Pulmonary nodule   Kidney mass   Adult failure to thrive   CKD (chronic kidney disease), stage IV ([facility])     AKI on CKD stage 4/ hyperkalemia/  anion gap metabolic acidosis.  Continue to have severe renal function impairment. She is not candidate for renal replacement therapy due to advanced dementia, and poor prognosis.   Still not taking medications by mouth.  Renal function with serum cr at 4,97, K is 5,4 and serum Na at 140 with Cl 104 and serum bicarbonate at 19 with anion gap 17.   Continue with bicarb drip at 50 ml per hr. Patient had one dose of furosemide yesterday 80 mg x1  Follow up renal function in am, patient candidate for comfort care.    2. Advanced dementia with delirium. Her cognitive impairment is very severe, she is not interactive and non verbal.  Not eating or taking oral medications. Has mittens in place.   Continue to have persistent delirium. Has mittens in place, continue with as needed lorazepam.    3. Reactive leukocytosis, anemia of chronic renal disease.  Cell count has been stable.    4. T2DM  Fasting glucose this am 170, capillary 171 and 166, with worsening renal function she is high risk for hypoglycemia.  Continue to hold on insulin therapy and check CBG only as needed. Patient with no po intake.    5. HTN Systolic blood pressure 166 to 170 mmHg.    Patient continue to be at high risk for worsening renal function and hyperkalemia   Status is: Inpatient  Remains inpatient appropriate because: worsening renal function    DVT prophylaxis:  Heparin   Code Status:    full  Family Communication:  I spoke over the phone with the patient's daughter about patient's  condition, plan of care, prognosis and all questions were addressed.  Patient continue to be full code, but Diana Tran is aware of worsening patient's condition and high risk of death.    Consultants:  Palliative Care    Subjective: Patient with no oral intake, continue to be poorly reactive.   Objective: Vitals:   11/04/21 2034 11/05/21 0429 11/05/21 1535 11/05/21 2100  BP: (!) 160/103 (!) 162/87 (!) 153/108 (!)  170/108  Pulse: 84 84 84 83  Resp:   20 19  Temp: 98.6 F (37 C) 97.9 F (36.6 C)  97.6 F (36.4 C)  TempSrc: Oral Oral  Axillary  SpO2: 99% 99% 93% 100%    Intake/Output Summary (Last 24 hours) at 11/06/2021 1242 Last data filed at 11/06/2021 0327 Gross per 24 hour  Intake --  Output 500 ml  Net -500 ml   There were no vitals filed for this visit.  Examination:   General: deconditioned and ill looking appearing  Neurology: Awake, not responsive to verbal stimuli, not following commands E ENT: positive pallor, no icterus, oral mucosa moist Cardiovascular: No JVD. S1-S2 present, rhythmic, no gallops, rubs, or murmurs. No lower extremity edema. Pulmonary: positive breath sounds bilaterally, adequate air movement, no wheezing, rhonchi or rales. Gastrointestinal. Abdomen soft and non tender Skin. No rashes Musculoskeletal: no joint deformities     Data Reviewed: I have personally reviewed following labs and imaging studies  CBC: Recent Labs  Lab 11/11/2021 1824 11/03/21 0415 11/05/21 0356  WBC 9.4 10.0 12.1*  NEUTROABS 8.4*  --   --   HGB 9.1* 8.4* 8.8*  HCT 29.0* 26.8* 27.2*  MCV 87.6 87.3 84.0  PLT 366 331 476   Basic Metabolic Panel: Recent Labs  Lab 10/29/2021 1824 11/03/21 0415 11/04/21 0403 11/05/21 0356 11/06/21 0736  NA 140 138 140 140 140  K 5.5* 5.4* 6.1* 6.0* 5.4*  CL 110 112* 111 107 104  CO2 20* 15* 15* 18* 19*  GLUCOSE 185* 212* 146* 160* 170*  BUN 79* 72* 83* 94* 104*  CREATININE 4.74* 4.30* 4.46* 4.39* 4.97*  CALCIUM 7.9* 7.2* 7.6* 7.9* 7.5*   GFR: Estimated Creatinine Clearance: 5.7 mL/min (A) (by C-G formula based on SCr of 4.97 mg/dL (H)). Liver Function Tests: No results for input(s): AST, ALT, ALKPHOS, BILITOT, PROT, ALBUMIN in the last 168 hours. No results for input(s): LIPASE, AMYLASE in the last 168 hours. No results for input(s): AMMONIA in the last 168 hours. Coagulation Profile: No results for input(s): INR, PROTIME in the  last 168 hours. Cardiac Enzymes: No results for input(s): CKTOTAL, CKMB, CKMBINDEX, TROPONINI in the last 168 hours. BNP (last 3 results) No results for input(s): PROBNP in the last 8760 hours. HbA1C: No results for input(s): HGBA1C in the last 72 hours. CBG: Recent Labs  Lab 11/05/21 1112 11/05/21 1740 11/05/21 2153 11/06/21 0907 11/06/21 1142  GLUCAP 169* 118* 139* 171* 166*   Lipid Profile: No results for input(s): CHOL, HDL, LDLCALC, TRIG, CHOLHDL, LDLDIRECT in the last 72 hours. Thyroid Function Tests: No results for input(s): TSH, T4TOTAL, FREET4, T3FREE, THYROIDAB in the last 72 hours. Anemia Panel: No results for input(s): VITAMINB12, FOLATE, FERRITIN, TIBC, IRON, RETICCTPCT in the last 72 hours.    Radiology Studies: I have reviewed all of the imaging during this hospital visit personally     Scheduled Meds:  Chlorhexidine Gluconate Cloth  6 each Topical Daily   heparin  5,000 Units Subcutaneous Q8H   insulin aspart  0-9 Units Subcutaneous  TID WC   sodium chloride flush  3 mL Intravenous Q12H   sodium zirconium cyclosilicate  10 g Oral TID   Continuous Infusions:  sodium bicarbonate 150 mEq in D5W infusion 50 mL/hr at 11/05/21 1525     LOS: 4 days        Dessie Tatem Gerome Apley, MD

## 2021-11-06 NOTE — Progress Notes (Signed)
Pt is very restless in the bed turning different positions and angles, not following direction, very high fall risk. Pt has a Foley in place and very high risk for dislodgement and her IV already dislodged 2x and replaced. Pt still not wanting to eat or taking any PO and would not open her mouth. Bed alarm in place. Will cont to monitor pt.

## 2021-11-07 DIAGNOSIS — R9389 Abnormal findings on diagnostic imaging of other specified body structures: Secondary | ICD-10-CM | POA: Diagnosis not present

## 2021-11-07 DIAGNOSIS — E875 Hyperkalemia: Secondary | ICD-10-CM | POA: Diagnosis not present

## 2021-11-07 DIAGNOSIS — N179 Acute kidney failure, unspecified: Secondary | ICD-10-CM | POA: Diagnosis not present

## 2021-11-07 DIAGNOSIS — G309 Alzheimer's disease, unspecified: Secondary | ICD-10-CM | POA: Diagnosis not present

## 2021-11-07 LAB — BASIC METABOLIC PANEL
Anion gap: 15 (ref 5–15)
BUN: 116 mg/dL — ABNORMAL HIGH (ref 8–23)
CO2: 24 mmol/L (ref 22–32)
Calcium: 7.2 mg/dL — ABNORMAL LOW (ref 8.9–10.3)
Chloride: 101 mmol/L (ref 98–111)
Creatinine, Ser: 5.4 mg/dL — ABNORMAL HIGH (ref 0.44–1.00)
GFR, Estimated: 7 mL/min — ABNORMAL LOW (ref >60)
Glucose, Bld: 222 mg/dL — ABNORMAL HIGH (ref 70–99)
Potassium: 5.4 mmol/L — ABNORMAL HIGH (ref 3.5–5.1)
Sodium: 140 mmol/L (ref 135–145)

## 2021-11-07 MED ORDER — SODIUM CHLORIDE 0.45 % IV SOLN: INTRAVENOUS | Status: DC

## 2021-11-07 MED ORDER — LIP MEDEX EX OINT
TOPICAL_OINTMENT | CUTANEOUS | Status: DC | PRN
  Administered 2021-11-10: 1 via TOPICAL
  Filled 2021-11-07: qty 7

## 2021-11-07 NOTE — Progress Notes (Signed)
I spoke with Mrs, Donella Stade patient's daughter at the bedside and patient's son over the phone. I explained patient's condition of worsening renal function and uremia, along with hyperkalemia.  Progressive renal failure and encephalopathy, multiorgan failure, poor prognosis and end of life condition. Patient has been worsening despite aggressive medical care.   I explained that there is a futile policy, that can be implemented to prevent patient's suffering.  They agree to discuss this policy with the medical provider team. For now they want to continue patient to be full code.   Will consult ethics committee to assist with futile policy.

## 2021-11-07 NOTE — Progress Notes (Signed)
PROGRESS NOTE    Diana Tran  SEG:[id number] DOB: 20-May-1932 DOA: 11/05/2021 PCP: Lin Landsman, MD    Brief Narrative:  Mrs. Meditz was admitted to the hospital with the working diagnosis of AKI on CKD stage IV with hyperkalemia.  Advance dementia with severe delirium.    87 yo female with the past medical history of advanced dementia chronic kidney disease stage IIIb, hypothyroidism, hypertension, dyslipidemia and depression who presented worsening worsening symptoms. Multiple emergency for last 6 months, 12/03, mechanical fall with left orbital floor fracture.12/07 ataxia.  Currently living in memory.  She was unable to give detailed history due to her cognitive impairment.  Blood pressure on admission 195/117, heart rate 75, temperature 97.0, oxygen saturation 97%, respiratory rate 18.  Patient was hypersomnolent, lungs clear to auscultation bilaterally, heart S1-S2, present, rhythmic, abdomen soft no lower extremity edema.   Sodium 140, potassium 5.5, chloride 110, bicarb 20, glucose 185, BUN 79, creatinine 4.74 White count 9.4, globin 9.1, hematocrit 39.0, platelets 366. SARS COVID-19 negative.   Urine analysis specific gravity > 1.030, > 300 protein, 0 To 5 white cells.   Chest radiograph with right rotation.  No frank infiltrates, personally reviewed.    EKG 80 bpm, left axis deviation, left anterior fascicular block, right bundle branch block, qtc 535, sinus rhythm, no significant ST segment or T wave changes, positive LVH.   Patient with worsening renal function, and hyperkalemia with anion gap metabolic acidosis.  Advance dementia with severe delirium.  Palliative care was consulted, patient's family declining to change code status despite poor prognosis.   BUN very elevated and continue worsening renal function. Will consult ethics committee to implement futile policy.   Assessment & Plan:   Principal Problem:   Acute kidney injury superimposed on chronic kidney  disease ([place]) Active Problems:   Type 2 diabetes mellitus ([place])   Hypertension associated with diabetes ([place])   Alzheimer's dementia ([place])   Hyperkalemia   Prolonged QT interval   Abnormal chest x-ray   Pulmonary nodule   Kidney mass   Adult failure to thrive   CKD (chronic kidney disease), stage IV ([facility])   AKI on CKD stage 4/ hyperkalemia/ anion gap metabolic acidosis.  She is not candidate for renal replacement therapy due to advanced dementia, and poor prognosis.    Today her BUN is up to 116 and her renal function continue worsening, today with serum cr at 5.4 with K at 5,4 and serum bicarbonate at 24.   Change IV fluids to 0.45% saline at 75 ml per hr. Follow up renal function in am, avoid hypotension and nephrotoxic medications.   Patient with poor prognosis, advance dementia, very poor oral intake. Recommended palliative care and comfort measures.    2. Advanced dementia with delirium.  Advanced dementia, with worsening delirium. Elevated BUN and high risk for aspiration.  Patient with poor prognosis, will plan to keep NPO to prevent aspiration. Continue supportive IV fluids.  As needed lorazepam for agitation.   3. Reactive leukocytosis, anemia of chronic renal disease.  No cell count today    4. T2DM  Patient with very poor to no oral intake, Her glucose this am is 222, because worsening renal failure, will hold on insulin therapy.    5. HTN Continue blood pressure monitoring    Patient continue to be at high risk for worsening renal failure, encephalopathy and multiorgan failure   Status is: Inpatient  Remains inpatient appropriate because: supportive IV fluids and neuro checks  DVT prophylaxis:  Heparin   Code Status:    Full   Family Communication:  I spoke with patient's daughter at the bedside, we talked in detail about patient's condition, plan of care and prognosis and all questions were addressed.    Consultants:  Palliative care     Subjective: Patient continue to be very confused and disorientated, she opens eyes to my voice.   Objective: Vitals:   11/05/21 0429 11/05/21 1535 11/05/21 2100 11/06/21 1407  BP: (!) 162/87 (!) 153/108 (!) 170/108 (!) 176/106  Pulse: 84 84 83 80  Resp:  20 19 18   Temp: 97.9 F (36.6 C)  97.6 F (36.4 C) 97.6 F (36.4 C)  TempSrc: Oral  Axillary Oral  SpO2: 99% 93% 100%     Intake/Output Summary (Last 24 hours) at 11/07/2021 1223 Last data filed at 11/06/2021 1500 Gross per 24 hour  Intake --  Output 150 ml  Net -150 ml   There were no vitals filed for this visit.  Examination:   General: deconditioned and ill looking appearing  Neurology: eyes closed but opens to voice, not following commands, combative at times.  E ENT: positive pallor, no icterus, oral mucosa moist Cardiovascular: No JVD. S1-S2 present, rhythmic, no gallops, rubs, or murmurs. Trace bilateral lower extremity edema. Pulmonary: positive breath sounds bilaterally, with no wheezing, poor inspiratory effort.  Gastrointestinal. Abdomen soft and non tender Skin. No rashes Musculoskeletal: no joint deformities   Data Reviewed: I have personally reviewed following labs and imaging studies  CBC: Recent Labs  Lab 10/24/2021 1824 11/03/21 0415 11/05/21 0356  WBC 9.4 10.0 12.1*  NEUTROABS 8.4*  --   --   HGB 9.1* 8.4* 8.8*  HCT 29.0* 26.8* 27.2*  MCV 87.6 87.3 84.0  PLT 366 331 098   Basic Metabolic Panel: Recent Labs  Lab 11/03/21 0415 11/04/21 0403 11/05/21 0356 11/06/21 0736 11/07/21 0419  NA 138 140 140 140 140  K 5.4* 6.1* 6.0* 5.4* 5.4*  CL 112* 111 107 104 101  CO2 15* 15* 18* 19* 24  GLUCOSE 212* 146* 160* 170* 222*  BUN 72* 83* 94* 104* 116*  CREATININE 4.30* 4.46* 4.39* 4.97* 5.40*  CALCIUM 7.2* 7.6* 7.9* 7.5* 7.2*   GFR: Estimated Creatinine Clearance: 5.3 mL/min (A) (by C-G formula based on SCr of 5.4 mg/dL (H)). Liver Function Tests: No results for input(s): AST, ALT,  ALKPHOS, BILITOT, PROT, ALBUMIN in the last 168 hours. No results for input(s): LIPASE, AMYLASE in the last 168 hours. No results for input(s): AMMONIA in the last 168 hours. Coagulation Profile: No results for input(s): INR, PROTIME in the last 168 hours. Cardiac Enzymes: No results for input(s): CKTOTAL, CKMB, CKMBINDEX, TROPONINI in the last 168 hours. BNP (last 3 results) No results for input(s): PROBNP in the last 8760 hours. HbA1C: No results for input(s): HGBA1C in the last 72 hours. CBG: Recent Labs  Lab 11/05/21 1112 11/05/21 1740 11/05/21 2153 11/06/21 0907 11/06/21 1142  GLUCAP 169* 118* 139* 171* 166*   Lipid Profile: No results for input(s): CHOL, HDL, LDLCALC, TRIG, CHOLHDL, LDLDIRECT in the last 72 hours. Thyroid Function Tests: No results for input(s): TSH, T4TOTAL, FREET4, T3FREE, THYROIDAB in the last 72 hours. Anemia Panel: No results for input(s): VITAMINB12, FOLATE, FERRITIN, TIBC, IRON, RETICCTPCT in the last 72 hours.    Radiology Studies: I have reviewed all of the imaging during this hospital visit personally     Scheduled Meds:  Chlorhexidine Gluconate Cloth  6 each Topical  Daily   heparin  5,000 Units Subcutaneous Q8H   sodium chloride flush  3 mL Intravenous Q12H   Continuous Infusions:  sodium chloride 75 mL/hr at 11/07/21 1141     LOS: 5 days        Nadean Montanaro Gerome Apley, MD

## 2021-11-08 ENCOUNTER — Other Ambulatory Visit: Payer: Self-pay

## 2021-11-08 ENCOUNTER — Encounter (HOSPITAL_COMMUNITY): Payer: Self-pay | Admitting: Internal Medicine

## 2021-11-08 DIAGNOSIS — N179 Acute kidney failure, unspecified: Secondary | ICD-10-CM | POA: Diagnosis not present

## 2021-11-08 DIAGNOSIS — N184 Chronic kidney disease, stage 4 (severe): Secondary | ICD-10-CM | POA: Diagnosis not present

## 2021-11-08 DIAGNOSIS — G309 Alzheimer's disease, unspecified: Secondary | ICD-10-CM | POA: Diagnosis not present

## 2021-11-08 DIAGNOSIS — R9389 Abnormal findings on diagnostic imaging of other specified body structures: Secondary | ICD-10-CM | POA: Diagnosis not present

## 2021-11-08 LAB — BASIC METABOLIC PANEL
Anion gap: 16 — ABNORMAL HIGH (ref 5–15)
BUN: 115 mg/dL — ABNORMAL HIGH (ref 8–23)
CO2: 23 mmol/L (ref 22–32)
Calcium: 7.2 mg/dL — ABNORMAL LOW (ref 8.9–10.3)
Chloride: 101 mmol/L (ref 98–111)
Creatinine, Ser: 5.53 mg/dL — ABNORMAL HIGH (ref 0.44–1.00)
GFR, Estimated: 7 mL/min — ABNORMAL LOW (ref >60)
Glucose, Bld: 200 mg/dL — ABNORMAL HIGH (ref 70–99)
Potassium: 5.5 mmol/L — ABNORMAL HIGH (ref 3.5–5.1)
Sodium: 140 mmol/L (ref 135–145)

## 2021-11-08 NOTE — Progress Notes (Signed)
PROGRESS NOTE    VERINA GALENO  NKN:[id number] DOB: December 08, 1931 DOA: 10/25/2021 PCP: Lin Landsman, MD    Brief Narrative:  Mrs. Ransome was admitted to the hospital with the working diagnosis of AKI on CKD stage IV with hyperkalemia.  Advance dementia with severe delirium.    87 yo female with the past medical history of advanced dementia chronic kidney disease stage IIIb, hypothyroidism, hypertension, dyslipidemia and depression who presented worsening worsening symptoms. Multiple emergency for last 6 months, 12/03, mechanical fall with left orbital floor fracture.12/07 ataxia.  Currently living in memory.  She was unable to give detailed history due to her cognitive impairment.  Blood pressure on admission 195/117, heart rate 75, temperature 97.0, oxygen saturation 97%, respiratory rate 18.  Patient was hypersomnolent, lungs clear to auscultation bilaterally, heart S1-S2, present, rhythmic, abdomen soft no lower extremity edema.   Sodium 140, potassium 5.5, chloride 110, bicarb 20, glucose 185, BUN 79, creatinine 4.74 White count 9.4, globin 9.1, hematocrit 39.0, platelets 366. SARS COVID-19 negative.   Urine analysis specific gravity > 1.030, > 300 protein, 0 To 5 white cells.   Chest radiograph with right rotation.  No frank infiltrates, personally reviewed.    EKG 80 bpm, left axis deviation, left anterior fascicular block, right bundle branch block, qtc 535, sinus rhythm, no significant ST segment or T wave changes, positive LVH.    Patient with worsening renal function, and hyperkalemia with anion gap metabolic acidosis.   Advance dementia with severe delirium.   Palliative care was consulted, patient's family declining to change code status despite poor prognosis.    BUN very elevated and continue worsening renal function. I have consulted the ethics committee to implement futile policy.    Assessment & Plan:   Principal Problem:   Acute kidney injury superimposed on  chronic kidney disease ([place]) Active Problems:   Type 2 diabetes mellitus ([place])   Hypertension associated with diabetes ([place])   Alzheimer's dementia ([place])   Hyperkalemia   Prolonged QT interval   Abnormal chest x-ray   Pulmonary nodule   Kidney mass   Adult failure to thrive   CKD (chronic kidney disease), stage IV ([facility])   AKI on CKD stage 4/ hyperkalemia/ anion gap metabolic acidosis.  She is not candidate for renal replacement therapy due to advanced dementia, and poor prognosis.     Today patient is less responsive to voice or light touch, her renal function continue to deteriorate to cr 5,53, K is 5,5 and serum bicarbonate is 23 with BUN 115.  Poor prognosis due to advance dementia. Because risk of aspiration now she is NPO.  Continue to recommended palliative care and comfort measures, change code status to DNR. Ethics committee has been consulted to implement futile policy.     2. Advanced dementia with delirium.  Patient not responsive to voice or light touch at the time of my examination.   Continue with as needed lorazepam for agitation and supportive medical therapy.     3. Reactive leukocytosis, anemia of chronic renal disease.  No cell count today    4. T2DM  Glucose is 200 today, continue to hold on insulin due to rapid worsening renal failure and risk of hypoglycemia Patient now is NPO due to risk of aspiration    5. HTN  Her blood pressure has been 156 to 379 mmHg systolic.      Patient continue to be at high risk for worsening renal failure   Status is: Inpatient  Remains  inpatient appropriate because: IV fluids and renal function monitoring   DVT prophylaxis:  Heparin   Code Status:    full  Family Communication:   No family at the bedside      Consultants:  Ethics committee  Palliative care     Subjective: Patient not responsive to light touch or voice, has mittens in place to protect devices.   Objective: Vitals:   11/07/21 1329  11/07/21 2000 11/07/21 2200 11/08/21 0543  BP: (!) 169/114  (!) 161/109 (!) 156/114  Pulse: 80  82 82  Resp: (!) 21 20 19 20   Temp: (!) 97.4 F (36.3 C)  (!) 97.5 F (36.4 C) (!) 97.5 F (36.4 C)  TempSrc: Axillary  Oral Oral  SpO2: 91%  98% 97%    Intake/Output Summary (Last 24 hours) at 11/08/2021 9629 Last data filed at 11/08/2021 0600 Gross per 24 hour  Intake 1101.56 ml  Output 450 ml  Net 651.56 ml   There were no vitals filed for this visit.  Examination:   General: deconditioned and ill looking appearing  Neurology: eyes closed and withdraws from touch.  E ENT: mild  pallor, no icterus, oral mucosa moist Cardiovascular: No JVD. S1-S2 present, rhythmic, no gallops, rubs, or murmurs. Trace lower extremity edema. Pulmonary: positive breath sounds bilaterally, adequate air movement, no wheezing, rhonchi or rales. Gastrointestinal. Abdomen soft and non tender Skin. No rashes Musculoskeletal: no deformities.      Data Reviewed: I have personally reviewed following labs and imaging studies  CBC: Recent Labs  Lab 11/15/2021 1824 11/03/21 0415 11/05/21 0356  WBC 9.4 10.0 12.1*  NEUTROABS 8.4*  --   --   HGB 9.1* 8.4* 8.8*  HCT 29.0* 26.8* 27.2*  MCV 87.6 87.3 84.0  PLT 366 331 528   Basic Metabolic Panel: Recent Labs  Lab 11/04/21 0403 11/05/21 0356 11/06/21 0736 11/07/21 0419 11/08/21 0411  NA 140 140 140 140 140  K 6.1* 6.0* 5.4* 5.4* 5.5*  CL 111 107 104 101 101  CO2 15* 18* 19* 24 23  GLUCOSE 146* 160* 170* 222* 200*  BUN 83* 94* 104* 116* 115*  CREATININE 4.46* 4.39* 4.97* 5.40* 5.53*  CALCIUM 7.6* 7.9* 7.5* 7.2* 7.2*   GFR: Estimated Creatinine Clearance: 5.1 mL/min (A) (by C-G formula based on SCr of 5.53 mg/dL (H)). Liver Function Tests: No results for input(s): AST, ALT, ALKPHOS, BILITOT, PROT, ALBUMIN in the last 168 hours. No results for input(s): LIPASE, AMYLASE in the last 168 hours. No results for input(s): AMMONIA in the last 168  hours. Coagulation Profile: No results for input(s): INR, PROTIME in the last 168 hours. Cardiac Enzymes: No results for input(s): CKTOTAL, CKMB, CKMBINDEX, TROPONINI in the last 168 hours. BNP (last 3 results) No results for input(s): PROBNP in the last 8760 hours. HbA1C: No results for input(s): HGBA1C in the last 72 hours. CBG: Recent Labs  Lab 11/05/21 1112 11/05/21 1740 11/05/21 2153 11/06/21 0907 11/06/21 1142  GLUCAP 169* 118* 139* 171* 166*   Lipid Profile: No results for input(s): CHOL, HDL, LDLCALC, TRIG, CHOLHDL, LDLDIRECT in the last 72 hours. Thyroid Function Tests: No results for input(s): TSH, T4TOTAL, FREET4, T3FREE, THYROIDAB in the last 72 hours. Anemia Panel: No results for input(s): VITAMINB12, FOLATE, FERRITIN, TIBC, IRON, RETICCTPCT in the last 72 hours.    Radiology Studies: I have reviewed all of the imaging during this hospital visit personally     Scheduled Meds:  Chlorhexidine Gluconate Cloth  6 each Topical Daily  heparin  5,000 Units Subcutaneous Q8H   sodium chloride flush  3 mL Intravenous Q12H   Continuous Infusions:  sodium chloride 75 mL/hr at 11/08/21 0402     LOS: 6 days        Nadie Fiumara Gerome Apley, MD

## 2021-11-09 DIAGNOSIS — E875 Hyperkalemia: Secondary | ICD-10-CM | POA: Diagnosis not present

## 2021-11-09 DIAGNOSIS — R9389 Abnormal findings on diagnostic imaging of other specified body structures: Secondary | ICD-10-CM | POA: Diagnosis not present

## 2021-11-09 DIAGNOSIS — Z515 Encounter for palliative care: Secondary | ICD-10-CM | POA: Diagnosis not present

## 2021-11-09 DIAGNOSIS — G309 Alzheimer's disease, unspecified: Secondary | ICD-10-CM | POA: Diagnosis not present

## 2021-11-09 DIAGNOSIS — Z66 Do not resuscitate: Secondary | ICD-10-CM

## 2021-11-09 DIAGNOSIS — Z7189 Other specified counseling: Secondary | ICD-10-CM | POA: Diagnosis not present

## 2021-11-09 DIAGNOSIS — N179 Acute kidney failure, unspecified: Secondary | ICD-10-CM | POA: Diagnosis not present

## 2021-11-09 LAB — BASIC METABOLIC PANEL
Anion gap: 18 — ABNORMAL HIGH (ref 5–15)
BUN: 125 mg/dL — ABNORMAL HIGH (ref 8–23)
CO2: 20 mmol/L — ABNORMAL LOW (ref 22–32)
Calcium: 7.1 mg/dL — ABNORMAL LOW (ref 8.9–10.3)
Chloride: 103 mmol/L (ref 98–111)
Creatinine, Ser: 5.67 mg/dL — ABNORMAL HIGH (ref 0.44–1.00)
GFR, Estimated: 7 mL/min — ABNORMAL LOW (ref >60)
Glucose, Bld: 189 mg/dL — ABNORMAL HIGH (ref 70–99)
Potassium: 5.5 mmol/L — ABNORMAL HIGH (ref 3.5–5.1)
Sodium: 141 mmol/L (ref 135–145)

## 2021-11-09 MED ORDER — HYDROMORPHONE HCL 1 MG/ML IJ SOLN
1.0000 mg | INTRAMUSCULAR | Status: DC | PRN
  Administered 2021-11-10 – 2021-11-12 (×2): 1 mg via INTRAVENOUS
  Filled 2021-11-09 (×3): qty 1

## 2021-11-09 NOTE — Ethics Note (Incomplete)
°  Ethics Consult Note  Initial contact w/ medical team 11/09/21, which was on patient's hospital day 7   Source of Consult: *** Current attending physician/service: Arrien, Jimmy Picket,*  Reason(s) for consult and ethical question(s): Medical futility in context of brain death   Information-gathering: Discussion with source of consult Chart review 11/09/21    Narrative:  Medical facts:Diana Tran     s brain dead and medical team plans to withdraw artificial life-prolonging treatment and anticipate cardiac death  Patient's Personal/Social Facts: Family / surrogate decision maker(s) has resisted withdrawal of artificial life support ***     Recommendations:  1)  It is ethically justifiable to withdraw artificial life support and to withhold CPR for patients who are brain dead. Brain death is considered medical death. Meaningful recovery is not possible. Therefore any further attempts at treatment  (including medications, surgeries, CPR, artifical life support, etc) are considered to be futile. Physicians are not obligated to offer or to continue futile medical interventions. Attending physician *** +/- CONSULTANTS *** qualified to determine brain death. Encourage reference to relevant resources. Standards of ethical care and relevant resources were discussed directly with the attending physician with regard to underlying ethical principles and ensuring appropriate steps were taken by medical team.   2) It is ethically justifiable to continue life support for limited time past time of brain death to allow for: organ donation if relevant.  time for family/others to convene at bedside for withdrawal of artificial life support, but this should not be delayed indefinitely.       Thank you for this consult. Ethics will continue to be involved in this case as needed, but will sign of for now. Determination of brain death has been made, and based on discussion w/ attending  physician and review of relevant resources, Ethics has no more to offer at this time.   *** has my personal cell phone number and has my permission to share this number at their discretion.  Secure message on Epic is also welcome but may not receive an immediate response.  Please reference AMION for on-call committee member if needed.   [place]

## 2021-11-09 NOTE — Progress Notes (Addendum)
Palliative:  HPI: 87 y.o. female  with past medical history of advanced dementia, CKD stage 3b, HTN, HLD, diabetes, depression, glaucoma, incidental 41m pulmonary nodule on CT chest 10/24/21 admitted on 11/03/2021 with frequent falls and worsening mental status. Noted to have new ataxia and recent fall with fracture to orbital floor requiring no intervention - neurology notes that symptoms due to progression of Alzheimer's dementia. Found to have acute on chronic kidney failure with CT showing masslike area of right kidney upper pole area - renal ultrasound 11/03/21 shows cystic changes to kidneys.      I met today at Diana Tran bedside with daughter and husband. We discussed at length her worsening kidney failure and how this progression has led her to end of life. We spoke about prognosis being limited to only days most likely. We discussed that the build up of toxins and dysequilibrium of electrolytes will ultimately cause her heart to stop and end her life. We discussed that CPR, shock, ventilator support will not do anything to support or fix the kidneys and therefore will not be successful or helpful to Diana Tran. We discussed that by putting her through CPR, shock, ventilator support we would be actually causing her harm and pain at the end of her life. I explained that we do not provide interventions to patients that are without benefit to them and especially if these interventions cause pain and suffering. We discussed that we all agree that dialysis is not beneficial and therefore CPR/resuscitative efforts would NOT be helpful but hurtful.   We reviewed her Living Will which I printed to review that although she left the final decision to her HCPOA (primary is her husband - at bedside) but also selected the option to consider the benefits of interventions and also her suffering and quality of life. She did NOT select the option below to provide all interventions to prolong life no matter what which  I pointed out to family. With Living Will and our long discussion Mr. Tran me that his wife has lived a good life. He tells me that he does not want her to suffer. He agrees with DNR knowing that this would cause her suffering. This was a difficult conclusion for them to come to but they understand that she is at end of life and when her heart stops we know that we have done everything possible to help Diana Tran. We agree to continue current measures but also provide measures to minimize suffering. Encouraged family to spend time with her while they can.   All questions/concerns addressed. Emotional support provided. Discussed with Dr. ACathlean Sauer RN.   Exam: Unresponsive. Restless. Breathing without distress. Abd soft. Extremities cold to touch. In the dying process.   Plan: - DNR decided - Continue current intervention - Provide measures to also minimize suffering - Anticipate hospital death - DNR canNOT be reversed without the permission of husband and should not be reversed given futility and harm of CPR  430min  AVinie Sill NP Palliative Medicine Team Pager [phone](Please see [email or web address] for schedule) Team Phone [phone]   Greater than 50%  of this time was spent counseling and coordinating care related to the above assessment and plan

## 2021-11-09 NOTE — Progress Notes (Signed)
I spoke yesterday evening with patient's daughter and husband at the bedside, I explained them patient's worsening renal function and high risk of death. Resuscitation attempts will be futile, and likely prolong suffering. I informed them about futility policy and ethics consultation. All questions were addressed.

## 2021-11-09 NOTE — Progress Notes (Signed)
Nutrition Brief Note  Patient screened for MST score of 2.0. LOS day #6. Chart reviewed. Palliative Care met with patient and family earlier today. Note following meeting indicates anticipation of hospital death and that patient is thought to currently be in the dying process.   RD will not complete assessment or follow at this time. No further nutrition interventions planned at this time.       Jarome Matin, MS, RD, LDN, CNSC Inpatient Clinical Dietitian RD pager # available in [place]  After hours/weekend pager # available in [facility]

## 2021-11-09 NOTE — Progress Notes (Signed)
PROGRESS NOTE    EUDORA GUEVARRA  ZOX:[id number] DOB: [birth year] DOA: 11/14/2021 PCP: Lin Landsman, MD    Brief Narrative:  Mrs. Smits was admitted to the hospital with the working diagnosis of AKI on CKD stage IV with hyperkalemia.  Advance dementia with severe delirium.    87 yo female with the past medical history of advanced dementia chronic kidney disease stage IIIb, hypothyroidism, hypertension, dyslipidemia and depression who presented worsening worsening symptoms. Multiple emergency for last 6 months, 12/03, mechanical fall with left orbital floor fracture.12/07 ataxia.  Currently living in memory.  She was unable to give detailed history due to her cognitive impairment.  Blood pressure on admission 195/117, heart rate 75, temperature 97.0, oxygen saturation 97%, respiratory rate 18.  Patient was hypersomnolent, lungs clear to auscultation bilaterally, heart S1-S2, present, rhythmic, abdomen soft no lower extremity edema.   Sodium 140, potassium 5.5, chloride 110, bicarb 20, glucose 185, BUN 79, creatinine 4.74 White count 9.4, globin 9.1, hematocrit 39.0, platelets 366. SARS COVID-19 negative.   Urine analysis specific gravity > 1.030, > 300 protein, 0 To 5 white cells.   Chest radiograph with right rotation.  No frank infiltrates, personally reviewed.    EKG 80 bpm, left axis deviation, left anterior fascicular block, right bundle branch block, qtc 535, sinus rhythm, no significant ST segment or T wave changes, positive LVH.    Patient with worsening renal function, and hyperkalemia with anion gap metabolic acidosis.   Advance dementia with severe delirium.   Palliative care was consulted, patient's family declining to change code status despite poor prognosis.    BUN very elevated and continue worsening renal function. I have consulted the ethics committee to implement futile policy.   12/19 Patient's husband decided  to change code status to DNR.  Ethic consult has  been cancelled.    Assessment & Plan:   Principal Problem:   Acute kidney injury superimposed on chronic kidney disease ([place]) Active Problems:   Type 2 diabetes mellitus ([place])   Hypertension associated with diabetes ([place])   Alzheimer's dementia ([place])   Hyperkalemia   Prolonged QT interval   Abnormal chest x-ray   Pulmonary nodule   Kidney mass   Adult failure to thrive   CKD (chronic kidney disease), stage IV ([facility])     AKI on CKD stage 4/ hyperkalemia/ anion gap metabolic acidosis.  She is not candidate for renal replacement therapy due to advanced dementia, and poor prognosis.     Her renal function continue to deteriorate despite IV fluids, her Na is 141, K is 5,5, Cl 103, bicarbonate is 20. BUN is 125 and cr is 5,67.  She has trace edema lower extremities and scattered rales on lung examination. Opens eyes but not responds to voice or touch.  Will discontinue IV fluids to prevent volume overload.   Continue with as needed lorazepam for agitation and will add as needed hydromorphone for respiratory distress or pain. Hold on further blood work for now.    2. Advanced dementia with delirium.  Worsening mentation, baseline dementia with acute delirium and no with severe uremia.     3. Reactive leukocytosis, anemia of chronic renal disease.  No cell count today    4. T2DM  Glucose is 189 today, holding on insulin to prevent hypoglycemia.   5. HTN  Holding antihypertensive medications.     Patient continue to be at high risk for worsening renal failure, imminent death.   Status is: Inpatient  Remains inpatient appropriate  because: imminent death     DVT prophylaxis:  Scd   Code Status:    DNR   Family Communication:   I spoke with patient's daughter and husband at the bedside, we talked in detail about patient's condition, plan of care and prognosis and all questions were addressed.     Consultants:  Palliative Care   P  Subjective: Patient continue to be  very deconditioned, she opens eyes but not to voice or touch, she has been NPO due to risk of aspiration, her family is at the bedside, she continue with mittens.   Objective: Vitals:   11/08/21 2009 11/09/21 0156 11/09/21 0634 11/09/21 1330  BP: 140/85 (!) 144/87 (!) 157/107 (!) 150/97  Pulse: 78 80 78 66  Resp: 16 20 20 16   Temp: 97.6 F (36.4 C) (!) 97.4 F (36.3 C) 97.7 F (36.5 C) 97.6 F (36.4 C)  TempSrc:   Oral Oral  SpO2: 95% 93%  99%    Intake/Output Summary (Last 24 hours) at 11/09/2021 1336 Last data filed at 11/09/2021 0900 Gross per 24 hour  Intake 2054.82 ml  Output 275 ml  Net 1779.82 ml   There were no vitals filed for this visit.  Examination:   General: deconditioned and ill looking appearing Neurology: not responsive to voice or light touch, opens eyes but not follows commands.  E ENT: positive  pallor, no icterus, oral mucosa moist,. Periorbital edema Cardiovascular: No JVD. S1-S2 present, rhythmic, no gallops, rubs, or murmurs. trace lower extremity edema. Pulmonary: positive breath sounds bilaterally, bilateral rales but not wheezing Gastrointestinal. Abdomen soft and non tender Skin. No rashes Musculoskeletal: no joint deformities     Data Reviewed: I have personally reviewed following labs and imaging studies  CBC: Recent Labs  Lab 10/29/2021 1824 11/03/21 0415 11/05/21 0356  WBC 9.4 10.0 12.1*  NEUTROABS 8.4*  --   --   HGB 9.1* 8.4* 8.8*  HCT 29.0* 26.8* 27.2*  MCV 87.6 87.3 84.0  PLT 366 331 683   Basic Metabolic Panel: Recent Labs  Lab 11/05/21 0356 11/06/21 0736 11/07/21 0419 11/08/21 0411 11/09/21 0349  NA 140 140 140 140 141  K 6.0* 5.4* 5.4* 5.5* 5.5*  CL 107 104 101 101 103  CO2 18* 19* 24 23 20*  GLUCOSE 160* 170* 222* 200* 189*  BUN 94* 104* 116* 115* 125*  CREATININE 4.39* 4.97* 5.40* 5.53* 5.67*  CALCIUM 7.9* 7.5* 7.2* 7.2* 7.1*   GFR: Estimated Creatinine Clearance: 5 mL/min (A) (by C-G formula based on SCr  of 5.67 mg/dL (H)). Liver Function Tests: No results for input(s): AST, ALT, ALKPHOS, BILITOT, PROT, ALBUMIN in the last 168 hours. No results for input(s): LIPASE, AMYLASE in the last 168 hours. No results for input(s): AMMONIA in the last 168 hours. Coagulation Profile: No results for input(s): INR, PROTIME in the last 168 hours. Cardiac Enzymes: No results for input(s): CKTOTAL, CKMB, CKMBINDEX, TROPONINI in the last 168 hours. BNP (last 3 results) No results for input(s): PROBNP in the last 8760 hours. HbA1C: No results for input(s): HGBA1C in the last 72 hours. CBG: Recent Labs  Lab 11/05/21 1112 11/05/21 1740 11/05/21 2153 11/06/21 0907 11/06/21 1142  GLUCAP 169* 118* 139* 171* 166*   Lipid Profile: No results for input(s): CHOL, HDL, LDLCALC, TRIG, CHOLHDL, LDLDIRECT in the last 72 hours. Thyroid Function Tests: No results for input(s): TSH, T4TOTAL, FREET4, T3FREE, THYROIDAB in the last 72 hours. Anemia Panel: No results for input(s): VITAMINB12, FOLATE, FERRITIN, TIBC,  IRON, RETICCTPCT in the last 72 hours.    Radiology Studies: I have reviewed all of the imaging during this hospital visit personally     Scheduled Meds:  Chlorhexidine Gluconate Cloth  6 each Topical Daily   heparin  5,000 Units Subcutaneous Q8H   sodium chloride flush  3 mL Intravenous Q12H   Continuous Infusions:  sodium chloride 75 mL/hr at 11/09/21 7471     LOS: 7 days        Rossana Molchan Gerome Apley, MD

## 2021-11-10 DIAGNOSIS — Z7189 Other specified counseling: Secondary | ICD-10-CM | POA: Diagnosis not present

## 2021-11-10 DIAGNOSIS — G309 Alzheimer's disease, unspecified: Secondary | ICD-10-CM | POA: Diagnosis not present

## 2021-11-10 DIAGNOSIS — N179 Acute kidney failure, unspecified: Secondary | ICD-10-CM | POA: Diagnosis not present

## 2021-11-10 DIAGNOSIS — R9389 Abnormal findings on diagnostic imaging of other specified body structures: Secondary | ICD-10-CM | POA: Diagnosis not present

## 2021-11-10 DIAGNOSIS — N184 Chronic kidney disease, stage 4 (severe): Secondary | ICD-10-CM | POA: Diagnosis not present

## 2021-11-10 DIAGNOSIS — Z515 Encounter for palliative care: Secondary | ICD-10-CM | POA: Diagnosis not present

## 2021-11-10 MED ORDER — CHLORHEXIDINE GLUCONATE 0.12 % MT SOLN
15.0000 mL | Freq: Two times a day (BID) | OROMUCOSAL | Status: DC
  Administered 2021-11-10 – 2021-11-12 (×3): 15 mL via OROMUCOSAL
  Filled 2021-11-10 (×2): qty 15

## 2021-11-10 MED ORDER — ORAL CARE MOUTH RINSE
15.0000 mL | Freq: Two times a day (BID) | OROMUCOSAL | Status: DC
  Administered 2021-11-11 – 2021-11-12 (×2): 15 mL via OROMUCOSAL

## 2021-11-10 NOTE — Plan of Care (Signed)

## 2021-11-10 NOTE — Care Management Important Message (Signed)
Important Message  Patient Details IM Letter placed in Patients room. Name: Diana Tran MRN: [id number] Date of Birth: [birth year]   Medicare Important Message Given:  Yes     Kerin Salen 11/10/2021, 11:27 AM

## 2021-11-10 NOTE — Progress Notes (Signed)
Physical Therapy Discharge Patient Details Name: Diana Tran MRN: [id number] DOB: 1932/05/07 Today's Date: 11/10/2021 Time:  -     Patient discharged from PT services secondary to medical decline - will need to re-order PT to resume therapy services.  Please see latest therapy progress note for current level of functioning and progress toward goals.    Progress and discharge plan discussed with patient and/or caregiver:  Per palliative and MD progress notes, "Anticipate hospital death "      Kati PT, DPT Acute Rehabilitation Services Pager: [phone] Office: [place] 11/10/2021, 8:50 AM

## 2021-11-10 NOTE — Progress Notes (Signed)
PROGRESS NOTE    Diana Tran  HYQ:[id number] DOB: February 23, 1932 DOA: 11/07/2021 PCP: Lin Landsman, MD    Brief Narrative:  Diana Tran was admitted to the hospital with the working diagnosis of AKI on CKD stage IV with hyperkalemia.  Advance dementia with severe delirium.    87 yo female with the past medical history of advanced dementia chronic kidney disease stage IIIb, hypothyroidism, hypertension, dyslipidemia and depression who presented worsening worsening symptoms. Multiple emergency for last 6 months, 12/03, mechanical fall with left orbital floor fracture.12/07 ataxia.  Currently living in memory.  She was unable to give detailed history due to her cognitive impairment.  Blood pressure on admission 195/117, heart rate 75, temperature 97.0, oxygen saturation 97%, respiratory rate 18.  Patient was hypersomnolent, lungs clear to auscultation bilaterally, heart S1-S2, present, rhythmic, abdomen soft no lower extremity edema.   Sodium 140, potassium 5.5, chloride 110, bicarb 20, glucose 185, BUN 79, creatinine 4.74 White count 9.4, globin 9.1, hematocrit 39.0, platelets 366. SARS COVID-19 negative.   Urine analysis specific gravity > 1.030, > 300 protein, 0 To 5 white cells.   Chest radiograph with right rotation.  No frank infiltrates, personally reviewed.    EKG 80 bpm, left axis deviation, left anterior fascicular block, right bundle branch block, qtc 535, sinus rhythm, no significant ST segment or T wave changes, positive LVH.    Patient with worsening renal function, and hyperkalemia with anion gap metabolic acidosis.   Advance dementia with severe delirium.   Palliative care was consulted, patient's family declining to change code status despite poor prognosis.    BUN very elevated and continue worsening renal function. I have consulted the ethics committee to implement futile policy.    12/19 Patient's husband decided  to change code status to DNR.  Ethic consult has  been cancelled.     Assessment & Plan:   Principal Problem:   Acute kidney injury superimposed on chronic kidney disease ([place]) Active Problems:   Type 2 diabetes mellitus ([place])   Hypertension associated with diabetes ([place])   Alzheimer's dementia ([place])   Hyperkalemia   Prolonged QT interval   Abnormal chest x-ray   Pulmonary nodule   Kidney mass   Adult failure to thrive   CKD (chronic kidney disease), stage IV (H[facility])     AKI on CKD stage 4/ hyperkalemia/ anion gap metabolic acidosis.  She is not candidate for renal replacement therapy due to advanced dementia, and poor prognosis.     Patient with signs of hypervolemia and IV fluids have been discontinued. Continue with as needed morphine for pain or respiratory distress, continue with as needed lorazepam in case of agitation No further blood work at this point Patient with worsening renal function and imminent death    2. Advanced dementia with delirium.  Today with her eyes closed and not responsive    3. Reactive leukocytosis, anemia of chronic renal disease.  No cell count today    4. T2DM  No further capillary glucose monitoring    5. HTN  Holding antihypertensive medications.      Status is: Inpatient  Remains inpatient appropriate because: Imminent death     DVT prophylaxis:  Na   Code Status:    DNR   Family Communication:   I spoke with patient's daughter at the bedside, we talked in detail about patient's condition, plan of care and prognosis and all questions were addressed.     Consultants:  Palliative care  Subjective: Patient with eyes closed, not responsive to touch or voice,. Her daughter is at the bedside   Objective: Vitals:   11/10/21 0609 11/10/21 0610 11/10/21 0621 11/10/21 1247  BP:   (!) 130/97 138/90  Pulse:   63 72  Resp: (!) 21 (!) 21 14 (!) 21  Temp:  (!) 96.7 F (35.9 C) (!) 96.2 F (35.7 C)   TempSrc:  Rectal Rectal   SpO2:  99% 99% 95%  Weight:      Height:         Intake/Output Summary (Last 24 hours) at 11/10/2021 1614 Last data filed at 11/10/2021 0258 Gross per 24 hour  Intake 3 ml  Output 275 ml  Net -272 ml   Filed Weights   11/10/21 0557  Weight: 58.8 kg    Examination:   General: deconditioned  Neurology: not responsive, not interactive  E ENT: positive pallor, no icterus, oral mucosa moist, palpebral edema Cardiovascular: No JVD. S1-S2 present, rhythmic, no gallops, rubs, or murmurs. No lower extremity edema. Pulmonary: positive breath sounds bilaterally, positive rales at the dependent zones Gastrointestinal. Abdomen soft and non tender Skin. No rashes Musculoskeletal: no joint deformities     Data Reviewed: I have personally reviewed following labs and imaging studies  CBC: Recent Labs  Lab 11/05/21 0356  WBC 12.1*  HGB 8.8*  HCT 27.2*  MCV 84.0  PLT 527   Basic Metabolic Panel: Recent Labs  Lab 11/05/21 0356 11/06/21 0736 11/07/21 0419 11/08/21 0411 11/09/21 0349  NA 140 140 140 140 141  K 6.0* 5.4* 5.4* 5.5* 5.5*  CL 107 104 101 101 103  CO2 18* 19* 24 23 20*  GLUCOSE 160* 170* 222* 200* 189*  BUN 94* 104* 116* 115* 125*  CREATININE 4.39* 4.97* 5.40* 5.53* 5.67*  CALCIUM 7.9* 7.5* 7.2* 7.2* 7.1*   GFR: Estimated Creatinine Clearance: 5.6 mL/min (A) (by C-G formula based on SCr of 5.67 mg/dL (H)). Liver Function Tests: No results for input(s): AST, ALT, ALKPHOS, BILITOT, PROT, ALBUMIN in the last 168 hours. No results for input(s): LIPASE, AMYLASE in the last 168 hours. No results for input(s): AMMONIA in the last 168 hours. Coagulation Profile: No results for input(s): INR, PROTIME in the last 168 hours. Cardiac Enzymes: No results for input(s): CKTOTAL, CKMB, CKMBINDEX, TROPONINI in the last 168 hours. BNP (last 3 results) No results for input(s): PROBNP in the last 8760 hours. HbA1C: No results for input(s): HGBA1C in the last 72 hours. CBG: Recent Labs  Lab 11/05/21 1112  11/05/21 1740 11/05/21 2153 11/06/21 0907 11/06/21 1142  GLUCAP 169* 118* 139* 171* 166*   Lipid Profile: No results for input(s): CHOL, HDL, LDLCALC, TRIG, CHOLHDL, LDLDIRECT in the last 72 hours. Thyroid Function Tests: No results for input(s): TSH, T4TOTAL, FREET4, T3FREE, THYROIDAB in the last 72 hours. Anemia Panel: No results for input(s): VITAMINB12, FOLATE, FERRITIN, TIBC, IRON, RETICCTPCT in the last 72 hours.    Radiology Studies: I have reviewed all of the imaging during this hospital visit personally     Scheduled Meds:  chlorhexidine  15 mL Mouth Rinse BID   Chlorhexidine Gluconate Cloth  6 each Topical Daily   mouth rinse  15 mL Mouth Rinse q12n4p   sodium chloride flush  3 mL Intravenous Q12H   Continuous Infusions:   LOS: 8 days        Oaklee Sunga Gerome Apley, MD

## 2021-11-10 NOTE — Progress Notes (Signed)
Palliative:  HPI: 87 y.o. female  with past medical history of advanced dementia, CKD stage 3b, HTN, HLD, diabetes, depression, glaucoma, incidental 9m pulmonary nodule on CT chest 10/24/21 admitted on 10/27/2021 with frequent falls and worsening mental status. Noted to have new ataxia and recent fall with fracture to orbital floor requiring no intervention - neurology notes that symptoms due to progression of Alzheimer's dementia. Found to have acute on chronic kidney failure with CT showing masslike area of right kidney upper pole area - renal ultrasound 11/03/21 shows cystic changes to kidneys.      I met at Diana Tran bedside but no family present and she is sleeping peacefully. Discussed with RN and updated on complicated situation with family discussions and decision making.   I received call from daughter, Diana Tran, later. Diana Tran expressed concern with IV fluids being stopped and I explained that it seems Dr. ACathlean Sauerstopped these based on his assessment with fluid heard in the lungs and if fluids are continued they will continue to build in the lungs causing respiratory distress and discomfort. Diana Tran verbalizes understanding but would like to touch base with Dr. ACathlean Sauer I reached out to Dr. [facility]athlean Sauerat [facility] and he plans to stop by and speak with her.   All questions/concerns addressed. Emotional support provided.   Exam: Unresponsive. Restless. Breathing without distress. Abd soft. Extremities cold to touch. In the dying process.    Plan: - DNR decided - Continue current intervention - Provide measures to also minimize suffering - Anticipate hospital death - DNR canNOT be reversed without the permission of husband (who is primary HCPOA) and should not be reversed given futility and harm of CPR.  [place] NP Palliative Medicine Team Pager [phone](Please see [email or web address] for schedule) Team Phone [phone]   Greater than 50%  of this time was spent  counseling and coordinating care related to the above assessment and plan

## 2021-11-10 NOTE — Progress Notes (Signed)
°   11/10/21 0621  Assess: MEWS Score  Temp (!) 96.2 F (35.7 C)  BP (!) 130/97  Pulse Rate 63  Resp 14  Level of Consciousness Responds to Pain  SpO2 99 %  O2 Device Nasal Cannula  Patient Activity (if Appropriate) In bed  O2 Flow Rate (L/min) 2 L/min  Assess: MEWS Score  MEWS Temp 1  MEWS Systolic 0  MEWS Pulse 0  MEWS RR 0  MEWS LOC 2  MEWS Score 3  MEWS Score Color Yellow  Assess: if the MEWS score is Yellow or Red  Were vital signs taken at a resting state? Yes  Focused Assessment No change from prior assessment  Does the patient meet 2 or more of the SIRS criteria? No  MEWS guidelines implemented *See Row Information* Yes  Treat  MEWS Interventions Other (Comment) (applied warm blankets and heat packs)  Take Vital Signs  Increase Vital Sign Frequency  Yellow: Q 2hr X 2 then Q 4hr X 2, if remains yellow, continue Q 4hrs  Escalate  MEWS: Escalate Yellow: discuss with charge nurse/RN and consider discussing with provider and RRT  Notify: Charge Nurse/RN  Name of Charge Nurse/RN Notified Karrie Doffing, RN  Date Charge Nurse/RN Notified 11/10/21  Time Charge Nurse/RN Notified [id number]  Notify: Provider  Provider Name/Title Gershon Cull, NP (on call)  Date Provider Notified 11/10/21  Time Provider Notified [phone]  Notification Type Page  Notification Reason Other (Comment) (patient's temperature)  Provider response No new orders  Date of Provider Response 11/10/21  Time of Provider Response [phone]  Document  Progress note created (see row info) Yes  Assess: SIRS CRITERIA  SIRS Temperature  1  SIRS Pulse 0  SIRS Respirations  0  SIRS WBC 0  SIRS Score Sum  1

## 2021-11-10 NOTE — Progress Notes (Signed)
Passed along to dayshift nurse about patient's rectal temperature. Placed warm blankets as well as heat packs in patient's groin and armpits to help with temperature. Also, had notified on call provider about patient's rectal temperature.

## 2021-11-10 NOTE — Plan of Care (Signed)
  Problem: Coping: Goal: Level of anxiety will decrease Outcome: Progressing   Problem: Pain Managment: Goal: General experience of comfort will improve Outcome: Progressing   Problem: Safety: Goal: Ability to remain free from injury will improve Outcome: Progressing   Problem: Skin Integrity: Goal: Risk for impaired skin integrity will decrease Outcome: Progressing   

## 2021-11-11 DIAGNOSIS — Z7189 Other specified counseling: Secondary | ICD-10-CM | POA: Diagnosis not present

## 2021-11-11 DIAGNOSIS — N184 Chronic kidney disease, stage 4 (severe): Secondary | ICD-10-CM | POA: Diagnosis not present

## 2021-11-11 DIAGNOSIS — N179 Acute kidney failure, unspecified: Secondary | ICD-10-CM | POA: Diagnosis not present

## 2021-11-11 DIAGNOSIS — Z515 Encounter for palliative care: Secondary | ICD-10-CM | POA: Diagnosis not present

## 2021-11-11 DIAGNOSIS — G309 Alzheimer's disease, unspecified: Secondary | ICD-10-CM | POA: Diagnosis not present

## 2021-11-11 DIAGNOSIS — R627 Adult failure to thrive: Secondary | ICD-10-CM | POA: Diagnosis not present

## 2021-11-11 MED ORDER — PROCHLORPERAZINE EDISYLATE 10 MG/2ML IJ SOLN: 5.0000 mg | INTRAMUSCULAR | Status: DC | PRN

## 2021-11-11 MED ORDER — ACETAMINOPHEN 650 MG RE SUPP: 650.0000 mg | Freq: Four times a day (QID) | RECTAL | Status: DC | PRN

## 2021-11-11 MED ORDER — GLYCOPYRROLATE 0.2 MG/ML IJ SOLN
0.4000 mg | INTRAMUSCULAR | Status: DC | PRN
  Filled 2021-11-11: qty 2

## 2021-11-11 MED ORDER — HYDROMORPHONE HCL 1 MG/ML IJ SOLN
1.0000 mg | Freq: Three times a day (TID) | INTRAMUSCULAR | Status: DC
  Administered 2021-11-11 – 2021-11-12 (×3): 1 mg via INTRAVENOUS
  Filled 2021-11-11 (×2): qty 1

## 2021-11-11 NOTE — Progress Notes (Signed)
Chaplain engaged in an initial visit with Maeva's family.  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  Her daughter shared of how many compliments Peni received as a caregiver.  She truly lived her life pouring out to others.  Daughter voiced how much she will be missed.  Grandson also stated that Dealie has been a good grandmother.   Chaplain offered presence, listening and support with Dewanna's two children and grandson at her bedside.  Chaplain also offered prayer over [place].   Chaplain will follow-up as needed.     11/11/21 1100  Clinical Encounter Type  Visited With Patient and family together  Visit Type Spiritual support;Patient actively dying  Spiritual Encounters  Spiritual Needs Prayer  Stress Factors  Family Stress Factors Major life changes

## 2021-11-11 NOTE — Progress Notes (Signed)
PROGRESS NOTE    [name]  YBO:[id number] DOB: February 17, 1932 DOA: 10/24/2021 PCP: Lin Landsman, MD    Brief Narrative:  Diana Tran was admitted to the hospital with the working diagnosis of AKI on CKD stage IV with hyperkalemia.  Advance dementia with severe delirium.    87 yo female with the past medical history of advanced dementia chronic kidney disease stage IIIb, hypothyroidism, hypertension, dyslipidemia and depression who presented worsening worsening symptoms. Multiple emergency for last 6 months, 12/03, mechanical fall with left orbital floor fracture.12/07 ataxia.  Currently living in memory.  She was unable to give detailed history due to her cognitive impairment.  Blood pressure on admission 195/117, heart rate 75, temperature 97.0, oxygen saturation 97%, respiratory rate 18.  Patient was hypersomnolent, lungs clear to auscultation bilaterally, heart S1-S2, present, rhythmic, abdomen soft no lower extremity edema.   Sodium 140, potassium 5.5, chloride 110, bicarb 20, glucose 185, BUN 79, creatinine 4.74 White count 9.4, globin 9.1, hematocrit 39.0, platelets 366. SARS COVID-19 negative.   Urine analysis specific gravity > 1.030, > 300 protein, 0 To 5 white cells.   Chest radiograph with right rotation.  No frank infiltrates, personally reviewed.    EKG 80 bpm, left axis deviation, left anterior fascicular block, right bundle branch block, qtc 535, sinus rhythm, no significant ST segment or T wave changes, positive LVH.    Patient with worsening renal function, and hyperkalemia with anion gap metabolic acidosis.   Advance dementia with severe delirium.   Palliative care was consulted, patient's family declining to change code status despite poor prognosis.    BUN very elevated and continue worsening renal function. I have consulted the ethics committee to implement futile policy.    12/19 Patient's husband decided  to change code status to DNR.  Ethic consult has  been cancelled.     Patient on comfort measures, imminent death.   Assessment & Plan:   Principal Problem:   Acute kidney injury superimposed on chronic kidney disease ([place]) Active Problems:   Type 2 diabetes mellitus ([place])   Hypertension associated with diabetes ([place])   Alzheimer's dementia ([place])   Hyperkalemia   Prolonged QT interval   Abnormal chest x-ray   Pulmonary nodule   Kidney mass   Adult failure to thrive   CKD (chronic kidney disease), stage IV ([facility])   AKI on CKD stage 4/ hyperkalemia/ anion gap metabolic acidosis.  She is not candidate for renal replacement therapy due to advanced dementia, and poor prognosis.    Patient has been asleep, continue with comfort measures per protocol. Case discussed with Palliative Care team, Vinie Sill NP and will place patient on scheduled morphine for better symptom control.  Patient's husband is at the bedside  Patient with worsening renal function and imminent death    2. Advanced dementia with delirium.  Patient has been sleeping   3. Reactive leukocytosis, anemia of chronic renal disease.  No cell count today    4. T2DM  Continue comfort care.    5. HTN  Continue with comfort care     Status is: Inpatient  Remains inpatient appropriate because: Imminent death   DVT prophylaxis: Na   Code Status:    DNR   Family Communication:   I spoke with patient's husband  at the bedside, we talked in detail about patient's condition, plan of care and prognosis and all questions were addressed.    Consultants:  Palliative Care   P  Subjective: Patient has been  sleeping  Objective: Vitals:   11/10/21 1247 11/10/21 2100 11/11/21 0603 11/11/21 0700  BP: 138/90 (!) 145/97 (!) 152/98   Pulse: 72 84 95   Resp: (!) 21 16 20    Temp:  99.1 F (37.3 C) 100.2 F (37.9 C) 99.3 F (37.4 C)  TempSrc:  Oral Oral   SpO2: 95% 96% 94%   Weight:      Height:        Intake/Output Summary (Last 24 hours) at 11/11/2021  1313 Last data filed at 11/11/2021 0603 Gross per 24 hour  Intake 0 ml  Output 450 ml  Net -450 ml   Filed Weights   11/10/21 0557  Weight: 58.8 kg    Examination:   General: deconditioned  Neurology: sedated and not responsive to light touch or voice  E ENT: positive pallor, no icterus, oral mucosa moist Cardiovascular: No JVD. S1-S2 present, rhythmic, Pulmonary: positive breath sounds bilaterally Gastrointestinal. Abdomen soft Skin. No rashes Musculoskeletal: no joint deformities   Data Reviewed: I have personally reviewed following labs and imaging studies  CBC: Recent Labs  Lab 11/05/21 0356  WBC 12.1*  HGB 8.8*  HCT 27.2*  MCV 84.0  PLT 903   Basic Metabolic Panel: Recent Labs  Lab 11/05/21 0356 11/06/21 0736 11/07/21 0419 11/08/21 0411 11/09/21 0349  NA 140 140 140 140 141  K 6.0* 5.4* 5.4* 5.5* 5.5*  CL 107 104 101 101 103  CO2 18* 19* 24 23 20*  GLUCOSE 160* 170* 222* 200* 189*  BUN 94* 104* 116* 115* 125*  CREATININE 4.39* 4.97* 5.40* 5.53* 5.67*  CALCIUM 7.9* 7.5* 7.2* 7.2* 7.1*   GFR: Estimated Creatinine Clearance: 5.6 mL/min (A) (by C-G formula based on SCr of 5.67 mg/dL (H)). Liver Function Tests: No results for input(s): AST, ALT, ALKPHOS, BILITOT, PROT, ALBUMIN in the last 168 hours. No results for input(s): LIPASE, AMYLASE in the last 168 hours. No results for input(s): AMMONIA in the last 168 hours. Coagulation Profile: No results for input(s): INR, PROTIME in the last 168 hours. Cardiac Enzymes: No results for input(s): CKTOTAL, CKMB, CKMBINDEX, TROPONINI in the last 168 hours. BNP (last 3 results) No results for input(s): PROBNP in the last 8760 hours. HbA1C: No results for input(s): HGBA1C in the last 72 hours. CBG: Recent Labs  Lab 11/05/21 1112 11/05/21 1740 11/05/21 2153 11/06/21 0907 11/06/21 1142  GLUCAP 169* 118* 139* 171* 166*   Lipid Profile: No results for input(s): CHOL, HDL, LDLCALC, TRIG, CHOLHDL, LDLDIRECT  in the last 72 hours. Thyroid Function Tests: No results for input(s): TSH, T4TOTAL, FREET4, T3FREE, THYROIDAB in the last 72 hours. Anemia Panel: No results for input(s): VITAMINB12, FOLATE, FERRITIN, TIBC, IRON, RETICCTPCT in the last 72 hours.    Radiology Studies: I have reviewed all of the imaging during this hospital visit personally     Scheduled Meds:  chlorhexidine  15 mL Mouth Rinse BID   Chlorhexidine Gluconate Cloth  6 each Topical Daily   mouth rinse  15 mL Mouth Rinse q12n4p   sodium chloride flush  3 mL Intravenous Q12H   Continuous Infusions:   LOS: 9 days        Diana Curto Gerome Apley, MD

## 2021-11-11 NOTE — Progress Notes (Signed)
Palliative:  HPI: 87 y.o. female  with past medical history of advanced dementia, CKD stage 3b, HTN, HLD, diabetes, depression, glaucoma, incidental 20m pulmonary nodule on CT chest 10/24/21 admitted on 11/14/2021 with frequent falls and worsening mental status. Noted to have new ataxia and recent fall with fracture to orbital floor requiring no intervention - neurology notes that symptoms due to progression of Alzheimer's dementia. Found to have acute on chronic kidney failure with CT showing masslike area of right kidney upper pole area - renal ultrasound 11/03/21 shows cystic changes to kidneys.     I met again today with Diana Tran and her husband at bedside. She is mostly resting comfortably. Husband expresses concern that she is not eating and I gently reminded him about her kidney failure and how the kidneys cannot rd toxins which build in her system making her very sleepy and unable to awaken enough to eat or drink. I gently reiterated to him that that her kidneys are failing and the best way we can care for her is to keep her comfortable and leave the rest in God's hands. He verbalized understanding. I offered him some water coffee and he agreed to ice for the comfort cart.   All questions/concerns addressed. Emotional support provided.   I spoke with RN and Dr. ACathlean Sauer Dr. ACathlean Saueralso spoke with husband and decision for some scheduled medication to ensure comfort was decided. Dr. ACathlean Sauerhe reports he will discuss further with [name].   Exam: Unresponsive. Restless. Breathing without distress. Abd soft. Extremities cold to touch. In the dying process.  Plan: - DNR.  - Anticipate hospital death.  - Low dose scheduled dilaudid 1 mg TID. - PRN medications reviewed to ensure symptom control.   15 min   AVinie Sill NP Palliative Medicine Team Pager [phone](Please see [email or web address] for schedule) Team Phone [phone]   Greater than 50%  of this time was spent counseling and  coordinating care related to the above assessment and plan

## 2021-11-12 DIAGNOSIS — G309 Alzheimer's disease, unspecified: Secondary | ICD-10-CM | POA: Diagnosis not present

## 2021-11-12 DIAGNOSIS — N179 Acute kidney failure, unspecified: Secondary | ICD-10-CM | POA: Diagnosis not present

## 2021-11-12 DIAGNOSIS — R9431 Abnormal electrocardiogram [ECG] [EKG]: Secondary | ICD-10-CM

## 2021-11-12 DIAGNOSIS — E875 Hyperkalemia: Secondary | ICD-10-CM | POA: Diagnosis not present

## 2021-11-12 DIAGNOSIS — R911 Solitary pulmonary nodule: Secondary | ICD-10-CM

## 2021-11-12 DIAGNOSIS — R9389 Abnormal findings on diagnostic imaging of other specified body structures: Secondary | ICD-10-CM | POA: Diagnosis not present

## 2021-11-12 DIAGNOSIS — E119 Type 2 diabetes mellitus without complications: Secondary | ICD-10-CM

## 2021-11-12 MED ORDER — ACETAMINOPHEN 325 MG PO TABS: 650.0000 mg | ORAL_TABLET | Freq: Four times a day (QID) | ORAL | Status: DC | PRN

## 2021-11-12 MED ORDER — BIOTENE DRY MOUTH MT LIQD: 15.0000 mL | OROMUCOSAL | Status: DC | PRN

## 2021-11-12 MED ORDER — POLYVINYL ALCOHOL 1.4 % OP SOLN
1.0000 [drp] | Freq: Four times a day (QID) | OPHTHALMIC | Status: DC | PRN
  Filled 2021-11-12: qty 15

## 2021-11-12 MED ORDER — ACETAMINOPHEN 650 MG RE SUPP: 650.0000 mg | Freq: Four times a day (QID) | RECTAL | Status: DC | PRN

## 2021-11-12 MED ORDER — HYDROMORPHONE BOLUS VIA INFUSION
1.0000 mg | INTRAVENOUS | Status: DC | PRN
  Filled 2021-11-12: qty 1

## 2021-11-12 MED ORDER — GLYCOPYRROLATE 0.2 MG/ML IJ SOLN: 0.2000 mg | INTRAMUSCULAR | Status: DC | PRN

## 2021-11-12 MED ORDER — GLYCOPYRROLATE 1 MG PO TABS
1.0000 mg | ORAL_TABLET | ORAL | Status: DC | PRN
  Filled 2021-11-12: qty 1

## 2021-11-12 MED ORDER — SODIUM CHLORIDE 0.9 % IV SOLN
0.5000 mg/h | INTRAVENOUS | Status: DC
  Administered 2021-11-12: 14:00:00 0.5 mg/h via INTRAVENOUS
  Filled 2021-11-12: qty 5

## 2021-11-22 NOTE — Death Summary Note (Signed)
Death Summary  Diana Tran DOB: 10/12/1932 DOA: Nov 07, 2021  PCP: Lin Landsman, MD  Admit date: 11-07-21 Date of Death: 11/17/21 Time of Death: 20:13 Notification: Lin Landsman, MD notified of death of 11-18-2021   History of present illness:  Diana Tran is a 88 y.o. female with a history of of advanced dementia, chronic kidney disease stage IIIb, hypothyroidism, hypertension, dyslipidemia and depression  Diana Tran presented with complaint of worsening dementia and falling.  Diana Tran did not improve after aggressive medical therapy for her worsening renal failure.    Multiple emergency visits for last 6 months, 12/03, mechanical fall with left orbital floor fracture.12/07 ataxia.  Patient was living in memory unit.  She was unable to give detailed history due to her cognitive impairment.  On her initial physical examination her blood pressure was 195/117, heart rate 75, temperature 97.0, oxygen saturation 97%, respiratory rate 18.  Patient was somnolent, lungs clear to auscultation bilaterally, heart S1-S2, present, rhythmic, abdomen soft no lower extremity edema.   Sodium 140, potassium 5.5, chloride 110, bicarb 20, glucose 185, BUN 79, creatinine 4.74 White count 9.4, globin 9.1, hematocrit 39.0, platelets 366. SARS COVID-19 negative.   Urine analysis specific gravity > 1.030, > 300 protein, 0 To 5 white cells.   Chest radiograph with right rotation.  No frank infiltrates, personally reviewed.    EKG 80 bpm, left axis deviation, left anterior fascicular block, right bundle branch block, qtc 535, sinus rhythm, no significant ST segment or T wave changes, positive LVH.    Patient with worsening renal function, and hyperkalemia with anion gap metabolic acidosis.   Advance dementia with severe delirium.   Palliative care was consulted, patient's family declining to change code status despite poor prognosis.    BUN very elevated and continue  worsening renal function. I have consulted the ethics committee to implement futile policy.    12/19 Patient's husband decided  to change code status to DNR.  Ethic consult has been cancelled.     Patient on comfort measures.      Final Diagnoses:  1.   AKI on CKD stage 4/ hyperkalemia/ anion gap metabolic acidosis, end stage renal disease  2. Advanced dementia with delirium.  3. Reactive leukocytosis, anemia of chronic renal disease.  4. T2DM   5. HTN  The results of significant diagnostics from this hospitalization (including imaging, microbiology, ancillary and laboratory) are listed below for reference.    Significant Diagnostic Studies: CT HEAD WO CONTRAST (5MM)  Result Date: 10/25/2021 CLINICAL DATA:  Fall yesterday, worsening swelling of the left eye EXAM: CT HEAD WITHOUT CONTRAST CT MAXILLOFACIAL WITHOUT CONTRAST TECHNIQUE: Multidetector CT imaging of the head and maxillofacial structures were performed using the standard protocol without intravenous contrast. Multiplanar CT image reconstructions of the maxillofacial structures were also generated. COMPARISON:  10/24/2021 FINDINGS: CT HEAD FINDINGS Brain: No evidence of acute infarction, hemorrhage, hydrocephalus, extra-axial collection or mass lesion/mass effect. Periventricular and deep white matter hypodensity. Vascular: No hyperdense vessel or unexpected calcification. Skull: Normal. Negative for fracture or focal lesion. Other: None. CT MAXILLOFACIAL FINDINGS Osseous: No new fracture or mandibular dislocation. Unchanged, minimally depressed left orbital floor and anterior and lateral left maxillary sinus wall fractures, as seen on prior day examination (series 4, image 46, series 9, image 33). No destructive process. Orbits: Orbital contents are intact. Sinuses: Redemonstrated high attenuation air-fluid level in the left maxillary sinus. Soft tissues: Soft tissue contusion of the left forehead and left orbit. IMPRESSION:  1. No  acute intracranial pathology. Small-vessel white matter disease. 2. Unchanged, minimally depressed left orbital floor and anterior and lateral left maxillary sinus wall fractures, as seen on prior day examination. 3. No new fracture. 4. Unchanged blood product in the left maxillary sinus. 5. Soft tissue contusion of the left forehead and left orbit. Electronically Signed   By: Delanna Ahmadi M.D.   On: 10/25/2021 17:01   CT Head Wo Contrast  Result Date: 10/24/2021 CLINICAL DATA:  88 year old female status post fall. Epistaxis. Left eyebrow laceration. EXAM: CT HEAD WITHOUT CONTRAST TECHNIQUE: Contiguous axial images were obtained from the base of the skull through the vertex without intravenous contrast. COMPARISON:  Head CT 06/13/2021.  Face CT today reported separately. FINDINGS: Brain: Chronic brain volume loss. Chronic confluent vascular calcifications at the basal ganglia. No midline shift, ventriculomegaly, mass effect, evidence of mass lesion, intracranial hemorrhage or evidence of cortically based acute infarction. Patchy and confluent cerebral white matter hypodensity with heterogeneity in the left thalamus appears stable. Vascular: Calcified atherosclerosis at the skull base. No suspicious intracranial vascular hyperdensity. Skull: Minimally displaced fracture posterior left maxillary sinus wall. Evidence of left orbital floor fracture. See face CT. No calvarium fracture identified. Sinuses/Orbits: Hemorrhage layering in the left maxillary sinus. But other Visualized paranasal sinuses and mastoids are stable and well aerated. Other: Mild superficial left periorbital soft tissue swelling. No other orbit or scalp soft tissue injury identified. IMPRESSION: 1. Evidence of left maxilla and orbital floor fractures with hemorrhage in the left maxillary sinus. See Face CT reported separately. 2. No acute traumatic injury to the brain identified. Chronic cerebral volume loss and small vessel disease.  Electronically Signed   By: Genevie Ann M.D.   On: 10/24/2021 08:58   CT Chest Wo Contrast  Result Date: 10/24/2021 CLINICAL DATA:  An 88 year old female presents for evaluation of interstitial infiltrates on chest x-ray. EXAM: CT CHEST WITHOUT CONTRAST TECHNIQUE: Multidetector CT imaging of the chest was performed following the standard protocol without IV contrast. COMPARISON:  Chest x-ray which was acquired on October 24, 2021. FINDINGS: Cardiovascular: Calcific atherosclerotic changes of the thoracic aorta without aneurysmal dilation. Mildly tortuous thoracic aorta. Normal caliber of central pulmonary vessels. Normal heart size without substantial pericardial effusion. Three-vessel coronary artery calcification. Mediastinum/Nodes: Scattered un enlarged lymph nodes throughout the chest. Esophagus grossly unremarkable by CT. Lungs/Pleura: No effusion. No lobar level consolidation. Patchy ground-glass nodularity in the RIGHT middle lobe (image 71/7 through image 85/7. Some of these nodular areas are nearly solid, the largest area measuring 6 mm. Discrete ground-glass nodule in the RIGHT upper lobe (image 45/7) approximately 6 mm. Mild interstitial prominence at the lung apices with septal thickening. Airways are patent. Mild basilar atelectasis. Upper Abdomen: Incidental imaging of upper abdominal contents shows lobular masslike area extending from the upper pole of the RIGHT kidney, this measures low-density but has very lobular margins measuring up to 4.3 x 4.6 cm with a density value of 20 Hounsfield units. Cystic lesions are likely present on the LEFT. Imaged portions the liver, spleen, pancreas and adrenal glands are unremarkable. Musculoskeletal: No acute bone finding. No destructive bone process. Spinal degenerative changes. IMPRESSION: Patchy ground-glass nodularity in the RIGHT middle lobe and to a lesser extent RIGHT upper lobe. Some of these nodular areas are nearly solid, the largest area measuring 6  mm. Findings may be related to atypical infection, viral or atypical pneumonia is considered. Smooth septal thickening could also be seen in the setting of pulmonary edema. Given  the presence of a discrete 6 mm pulmonary nodule and other areas of ground-glass would suggest the following. Non-contrast chest CT at 3-6 months is recommended. If nodules persist, subsequent management will be based upon the most suspicious nodule(s). This recommendation follows the consensus statement: Guidelines for Management of Incidental Pulmonary Nodules Detected on CT Images: From the Fleischner Society 2017; Radiology 2017; 284:228-243. Incidental imaging of the upper pole of the RIGHT kidney shows lobular masslike area extending from the upper pole of the RIGHT kidney, this measures up to 4.3 x 4.6 cm with a density value of 20 Hounsfield units. Despite low-density a renal mass is not excluded based on the contours. Initial assessment could be performed with renal sonogram to exclude renal mass. Three-vessel coronary artery calcification. Aortic atherosclerosis. Electronically Signed   By: Zetta Bills M.D.   On: 10/24/2021 10:50   CT Cervical Spine Wo Contrast  Result Date: 10/24/2021 CLINICAL DATA:  88 year old female status post fall. Epistaxis. Left eyebrow laceration. EXAM: CT CERVICAL SPINE WITHOUT CONTRAST TECHNIQUE: Multidetector CT imaging of the cervical spine was performed without intravenous contrast. Multiplanar CT image reconstructions were also generated. COMPARISON:  Head and face CT today. FINDINGS: Alignment: Straightening of cervical lordosis with mild degenerative appearing anterolisthesis of both C3 on C4 and C4 on C5. Facet ankylosis at the former. Bilateral posterior element alignment is within normal limits. Cervicothoracic junction alignment is within normal limits. Skull base and vertebrae: Visualized skull base is intact. No atlanto-occipital dissociation. C1 and C2 appear intact and aligned. No  acute osseous abnormality identified. Soft tissues and spinal canal: No prevertebral fluid or swelling. No visible canal hematoma. Negative for age visible noncontrast deep soft tissue spaces of the neck. Disc levels: C3-C4 facet degeneration and ankylosis. Developing posterior interbody ankylosis also at that level. Advanced disc and endplate degeneration at C5-C6 and C6-C7. But mild if any associated spinal stenosis. Upper chest: Negative. IMPRESSION: 1. No acute traumatic injury identified in the cervical spine. 2. Cervical spine degeneration, including chronic facet degeneration and ankylosis at C3-C4. Electronically Signed   By: Genevie Ann M.D.   On: 10/24/2021 09:07   US RENAL  Result Date: 11/03/2021 CLINICAL DATA:  Acute kidney injury EXAM: RENAL / URINARY TRACT ULTRASOUND COMPLETE COMPARISON:  None. FINDINGS: Right Kidney: Polycystic kidney with superimposed echogenic cortex. Renal length measures up to 13.6 cm due to cystic change. No hydronephrosis. No detected solid mass Left Kidney: Polycystic kidney with superimposed echogenic cortex. Renal length measures up to 15.5 cm due to cystic change. No hydronephrosis. No detected solid mass Bladder: Not visualized. IMPRESSION: Polycystic kidneys with underlying medical renal disease. No hydronephrosis or asymmetry. Electronically Signed   By: Jorje Guild M.D.   On: 11/03/2021 08:21   DG Chest Port 1 View  Result Date: 11/09/2021 CLINICAL DATA:  Wheezing, dementia EXAM: PORTABLE CHEST 1 VIEW COMPARISON:  CT chest dated 10/24/2021 FINDINGS: Mild right perihilar opacity. Left basilar opacity, likely atelectasis. No pleural effusion or pneumothorax. Cardiomegaly. IMPRESSION: Mild right perihilar and left basilar opacity, likely atelectasis. Right lower lobe pneumonia is not entirely excluded. Electronically Signed   By: Julian Hy M.D.   On: 10/25/2021 19:03   DG Chest Portable 1 View  Result Date: 10/24/2021 CLINICAL DATA:  88 year old  female status post fall. Epistaxis. Left eyebrow laceration. EXAM: PORTABLE CHEST 1 VIEW COMPARISON:  Portable chest 09/24/2020. FINDINGS: Portable AP semi upright view at 0845 hours. Skin fold artifact along the left chest wall. Lung volumes and mediastinal contours  are stable. Mildly tortuous thoracic aorta. Visualized tracheal air column is within normal limits. Vague increased left upper and left lower lung opacity. Allowing for portable technique the right lung appears clear. No pneumothorax or pleural effusion identified. No acute osseous abnormality identified. Negative visible bowel gas. IMPRESSION: Vague and nonspecific increased left upper and left lower lung opacity. Consider aspiration or infection in this setting. PA and lateral views may be helpful when feasible. Electronically Signed   By: Genevie Ann M.D.   On: 10/24/2021 08:55   CT Maxillofacial Wo Contrast  Result Date: 10/25/2021 CLINICAL DATA:  Fall yesterday, worsening swelling of the left eye EXAM: CT HEAD WITHOUT CONTRAST CT MAXILLOFACIAL WITHOUT CONTRAST TECHNIQUE: Multidetector CT imaging of the head and maxillofacial structures were performed using the standard protocol without intravenous contrast. Multiplanar CT image reconstructions of the maxillofacial structures were also generated. COMPARISON:  10/24/2021 FINDINGS: CT HEAD FINDINGS Brain: No evidence of acute infarction, hemorrhage, hydrocephalus, extra-axial collection or mass lesion/mass effect. Periventricular and deep white matter hypodensity. Vascular: No hyperdense vessel or unexpected calcification. Skull: Normal. Negative for fracture or focal lesion. Other: None. CT MAXILLOFACIAL FINDINGS Osseous: No new fracture or mandibular dislocation. Unchanged, minimally depressed left orbital floor and anterior and lateral left maxillary sinus wall fractures, as seen on prior day examination (series 4, image 46, series 9, image 33). No destructive process. Orbits: Orbital contents are  intact. Sinuses: Redemonstrated high attenuation air-fluid level in the left maxillary sinus. Soft tissues: Soft tissue contusion of the left forehead and left orbit. IMPRESSION: 1. No acute intracranial pathology. Small-vessel white matter disease. 2. Unchanged, minimally depressed left orbital floor and anterior and lateral left maxillary sinus wall fractures, as seen on prior day examination. 3. No new fracture. 4. Unchanged blood product in the left maxillary sinus. 5. Soft tissue contusion of the left forehead and left orbit. Electronically Signed   By: Delanna Ahmadi M.D.   On: 10/25/2021 17:01   CT Maxillofacial Wo Contrast  Result Date: 10/24/2021 CLINICAL DATA:  88 year old female status post fall. Epistaxis. Left eyebrow laceration. EXAM: CT MAXILLOFACIAL WITHOUT CONTRAST TECHNIQUE: Multidetector CT imaging of the maxillofacial structures was performed. Multiplanar CT image reconstructions were also generated. COMPARISON:  Head CT today. FINDINGS: Osseous: Absent dentition. Mandible intact and normally located. No zygoma fracture. No nasal bone fracture. No pterygoid fracture. Right maxilla appears intact. Comminuted but minimally displaced fracture along the posterior and inferior walls of the left maxillary sinus. Trace deep soft tissue gas along the anterior maxilla. Underlying osteopenia. Central skull base appears intact. Cervical spine is reported separately. Orbits: Right orbit walls are intact. Comminuted but minimally displaced fracture of the left orbital floor along the course of the infraorbital nerve. Small volume extraconal gas within the left inferior orbit. Other left orbital walls appear intact. Globes and other intraorbital soft tissues are within normal limits. Sinuses: Hemorrhage layering in the left maxillary sinus. Other paranasal sinuses and mastoids are clear. Negative nasal cavity. Soft tissues: Mild left periorbital soft tissue swelling and stranding. Trace posttraumatic gas  tracking along the anterior left maxillary wall. Otherwise negative visible noncontrast deep soft tissue spaces of the face. Limited intracranial: Reported separately. IMPRESSION: 1. Comminuted but minimally displaced fractures of the left orbital floor and left maxillary sinus. Trace gas within the left orbit. No herniated orbital contents or intraorbital hematoma. Hemorrhage layering in the left maxillary sinus and trace adjacent soft tissue gas. Mild superficial left periorbital hematoma. 2. Head and Cervical spine CT reported separately.  Electronically Signed   By: Genevie Ann M.D.   On: 10/24/2021 09:03    Microbiology: No results found for this or any previous visit (from the past 240 hour(s)).   Labs: Basic Metabolic Panel: Recent Labs  Lab 11/07/21 0419 11/08/21 0411 11/09/21 0349  NA 140 140 141  K 5.4* 5.5* 5.5*  CL 101 101 103  CO2 24 23 20*  GLUCOSE 222* 200* 189*  BUN 116* 115* 125*  CREATININE 5.40* 5.53* 5.67*  CALCIUM 7.2* 7.2* 7.1*   Liver Function Tests: No results for input(s): AST, ALT, ALKPHOS, BILITOT, PROT, ALBUMIN in the last 168 hours. No results for input(s): LIPASE, AMYLASE in the last 168 hours. No results for input(s): AMMONIA in the last 168 hours. CBC: No results for input(s): WBC, NEUTROABS, HGB, HCT, MCV, PLT in the last 168 hours. Cardiac Enzymes: No results for input(s): CKTOTAL, CKMB, CKMBINDEX, TROPONINI in the last 168 hours. D-Dimer No results for input(s): DDIMER in the last 72 hours. BNP: Invalid input(s): POCBNP CBG: Recent Labs  Lab 11/06/21 0907 11/06/21 1142  GLUCAP 171* 166*   Anemia work up No results for input(s): VITAMINB12, FOLATE, FERRITIN, TIBC, IRON, RETICCTPCT in the last 72 hours. Urinalysis    Component Value Date/Time   COLORURINE YELLOW 11/04/2021 1832   APPEARANCEUR CLEAR 11/04/2021 1832   LABSPEC >1.030 (H) 11/04/2021 1832   PHURINE 5.5 11/04/2021 1832   GLUCOSEU NEGATIVE 11/04/2021 1832   HGBUR TRACE (A)  11/04/2021 1832   BILIRUBINUR NEGATIVE 11/04/2021 1832   KETONESUR NEGATIVE 11/04/2021 1832   PROTEINUR >300 (A) 11/04/2021 1832   NITRITE NEGATIVE 11/04/2021 1832   LEUKOCYTESUR NEGATIVE 11/04/2021 1832   Sepsis Labs Invalid input(s): PROCALCITONIN,  WBC,  LACTICIDVEN     SIGNED:  Tawni Millers, MD  [facility] 11/13/2021, 8:41 AM Pager   If 7PM-7AM, please contact night-coverage [email or web address] Password [email or web address]

## 2021-11-22 NOTE — Progress Notes (Signed)
Dilaudid drip waste 90 ml by [place] Mariha Sleeper, RN and Ellen Henri, RN

## 2021-11-22 NOTE — Progress Notes (Signed)
° ° °  OVERNIGHT PROGRESS REPORT  Notified by RN that patient has expired at 2013  Patient was DNR  2 RN verified.  Family was available to RN.   Neomia Glass MHA, MSN, FNP-BC Nurse Practitioner [facility]

## 2021-11-22 NOTE — Progress Notes (Signed)
RN at bedside patient comfort care and observed to be absent of breathing, no apical pulse and pupils non reactive, on call provider notified, 2 RN pronounced death per order. Patient time of death 02/21/2012, family contacted and is at bedside, patient transferred to [place].

## 2021-11-22 NOTE — Progress Notes (Signed)
PROGRESS NOTE    SYMPHONI HELBLING  ENI:[id number] DOB: 17-Feb-1932 DOA: 11/10/2021 PCP: Lin Landsman, MD    Brief Narrative:  Mrs. Ryant was admitted to the hospital with the working diagnosis of AKI on CKD stage IV with hyperkalemia.  Advance dementia with severe delirium.    88 yo female with the past medical history of advanced dementia chronic kidney disease stage IIIb, hypothyroidism, hypertension, dyslipidemia and depression who presented worsening worsening symptoms. Multiple emergency for last 6 months, 12/03, mechanical fall with left orbital floor fracture.12/07 ataxia.  Currently living in memory.  She was unable to give detailed history due to her cognitive impairment.  Blood pressure on admission 195/117, heart rate 75, temperature 97.0, oxygen saturation 97%, respiratory rate 18.  Patient was hypersomnolent, lungs clear to auscultation bilaterally, heart S1-S2, present, rhythmic, abdomen soft no lower extremity edema.   Sodium 140, potassium 5.5, chloride 110, bicarb 20, glucose 185, BUN 79, creatinine 4.74 White count 9.4, globin 9.1, hematocrit 39.0, platelets 366. SARS COVID-19 negative.   Urine analysis specific gravity > 1.030, > 300 protein, 0 To 5 white cells.   Chest radiograph with right rotation.  No frank infiltrates, personally reviewed.    EKG 80 bpm, left axis deviation, left anterior fascicular block, right bundle branch block, qtc 535, sinus rhythm, no significant ST segment or T wave changes, positive LVH.    Patient with worsening renal function, and hyperkalemia with anion gap metabolic acidosis.   Advance dementia with severe delirium.   Palliative care was consulted, patient's family declining to change code status despite poor prognosis.    BUN very elevated and continue worsening renal function. I have consulted the ethics committee to implement futile policy.    12/19 Patient's husband decided  to change code status to DNR.  Ethic consult has  been cancelled.     Patient on comfort measures, imminent death.   Patient has been noted to have abdominal discomfort and increased secretions, she has been getting scheduled hydromorphone.    Assessment & Plan:   Principal Problem:   Acute kidney injury superimposed on chronic kidney disease ([employer]) Active Problems:   Type 2 diabetes mellitus ([place])   Hypertension associated with diabetes ([place])   Alzheimer's dementia ([place])   Hyperkalemia   Prolonged QT interval   Abnormal chest x-ray   Pulmonary nodule   Kidney mass   Adult failure to thrive   CKD (chronic kidney disease), stage IV ([facility])   AKI on CKD stage 4/ hyperkalemia/ anion gap metabolic acidosis.  She is not candidate for renal replacement therapy due to advanced dementia, and poor prognosis.     Patient noted with abdominal discomfort despite scheduled hydromorphone, she has been less reactive. I spoke with her daughter at the bedside and her son over the phone and will plan to start hydromorphone continuous infusion to improve symptom control.  Patient today is more deconditioned and week, less reactive, imminent death.    2. Advanced dementia with delirium.  Comfort care and symptoms control.    3. Reactive leukocytosis, anemia of chronic renal disease.  No cell count today    4. T2DM  Continue comfort care.    5. HTN  Continue with comfort care    Status is: Inpatient  Remains inpatient appropriate because: imminent death    DVT prophylaxis: Na   Code Status:    DNR   Family Communication:   I spoke with patient's daughter at the bedside,and son over the phone,  we talked in detail about patient's condition, plan of care and prognosis and all questions were addressed.    Consultants:  Palliative care    Subjective: Patient has been with eyes closed, less reactive and decreased respiratory rate, very weak and deconditioned   Objective: Vitals:   11/11/21 0700 11/11/21 1410 11/11/21 2047 11-22-2021  1334  BP:  (!) 145/101 109/66 111/68  Pulse:  88 74 (!) 109  Resp:  (!) 24 14 10   Temp: 99.3 F (37.4 C) 98.8 F (37.1 C) 98.8 F (37.1 C) 98.2 F (36.8 C)  TempSrc:  Oral Oral Oral  SpO2:  95% 93% 100%  Weight:      Height:        Intake/Output Summary (Last 24 hours) at 22-Nov-2021 1353 Last data filed at 11/22/21 1027 Gross per 24 hour  Intake 3 ml  Output 625 ml  Net -622 ml   Filed Weights   11/10/21 0557  Weight: 58.8 kg    Examination:   General: deconditioned  Neurology: Awake and alert, non focal  E ENT: mild pallor, no icterus, oral mucosa moist Cardiovascular: No JVD. S1-S2 present, rhythmic, no gallops, rubs, or murmurs. No lower extremity edema. Pulmonary: positive breath sounds bilaterally, decreased respiratory rate Gastrointestinal. Abdomen soft  Skin. No rashes Musculoskeletal: no joint deformities     Data Reviewed: I have personally reviewed following labs and imaging studies  CBC: No results for input(s): WBC, NEUTROABS, HGB, HCT, MCV, PLT in the last 168 hours. Basic Metabolic Panel: Recent Labs  Lab 11/06/21 0736 11/07/21 0419 11/08/21 0411 11/09/21 0349  NA 140 140 140 141  K 5.4* 5.4* 5.5* 5.5*  CL 104 101 101 103  CO2 19* 24 23 20*  GLUCOSE 170* 222* 200* 189*  BUN 104* 116* 115* 125*  CREATININE 4.97* 5.40* 5.53* 5.67*  CALCIUM 7.5* 7.2* 7.2* 7.1*   GFR: Estimated Creatinine Clearance: 5.6 mL/min (A) (by C-G formula based on SCr of 5.67 mg/dL (H)). Liver Function Tests: No results for input(s): AST, ALT, ALKPHOS, BILITOT, PROT, ALBUMIN in the last 168 hours. No results for input(s): LIPASE, AMYLASE in the last 168 hours. No results for input(s): AMMONIA in the last 168 hours. Coagulation Profile: No results for input(s): INR, PROTIME in the last 168 hours. Cardiac Enzymes: No results for input(s): CKTOTAL, CKMB, CKMBINDEX, TROPONINI in the last 168 hours. BNP (last 3 results) No results for input(s): PROBNP in the last  8760 hours. HbA1C: No results for input(s): HGBA1C in the last 72 hours. CBG: Recent Labs  Lab 11/05/21 1740 11/05/21 2153 11/06/21 0907 11/06/21 1142  GLUCAP 118* 139* 171* 166*   Lipid Profile: No results for input(s): CHOL, HDL, LDLCALC, TRIG, CHOLHDL, LDLDIRECT in the last 72 hours. Thyroid Function Tests: No results for input(s): TSH, T4TOTAL, FREET4, T3FREE, THYROIDAB in the last 72 hours. Anemia Panel: No results for input(s): VITAMINB12, FOLATE, FERRITIN, TIBC, IRON, RETICCTPCT in the last 72 hours.    Radiology Studies: I have reviewed all of the imaging during this hospital visit personally     Scheduled Meds:  chlorhexidine  15 mL Mouth Rinse BID   Chlorhexidine Gluconate Cloth  6 each Topical Daily   mouth rinse  15 mL Mouth Rinse q12n4p   sodium chloride flush  3 mL Intravenous Q12H   Continuous Infusions:  HYDROmorphone       LOS: 10 days        Curry Dulski Gerome Apley, MD

## 2021-11-22 DEATH — deceased

## 2023-11-27 IMAGING — CT CT HEAD W/O CM
3 series · 14 of 46 positions shown, 16 images · non-contrast
Comparison: 10/24/2021

CLINICAL DATA: Fall yesterday, worsening swelling of the left eye

EXAM:
CT HEAD WITHOUT CONTRAST
CT MAXILLOFACIAL WITHOUT CONTRAST
TECHNIQUE: Multidetector CT imaging of the head and maxillofacial structures
were performed using the standard protocol without intravenous
contrast. Multiplanar CT image reconstructions of the maxillofacial
structures were also generated.

[Series 3: head wo · axial · 0.47mm/px · z∈[-109,+11]mm · 8 of 29 slices shown, 10 images]
[im 3/29  brain]
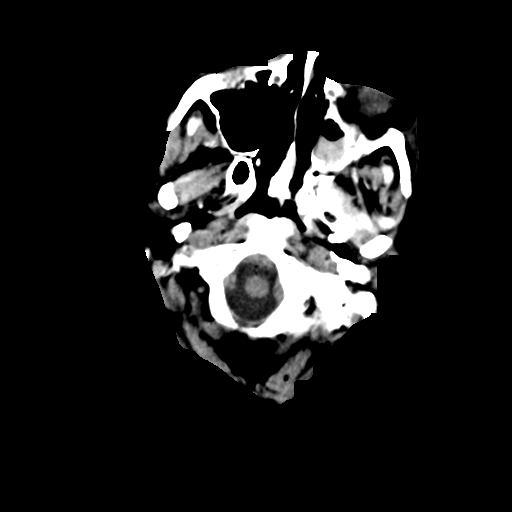
[im 3/29  bone]
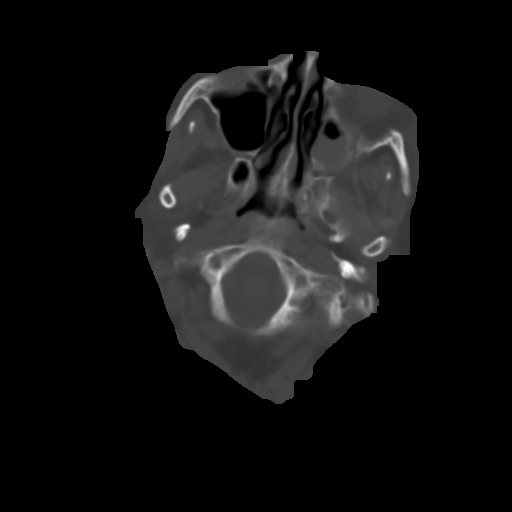
[im 7/29  brain]
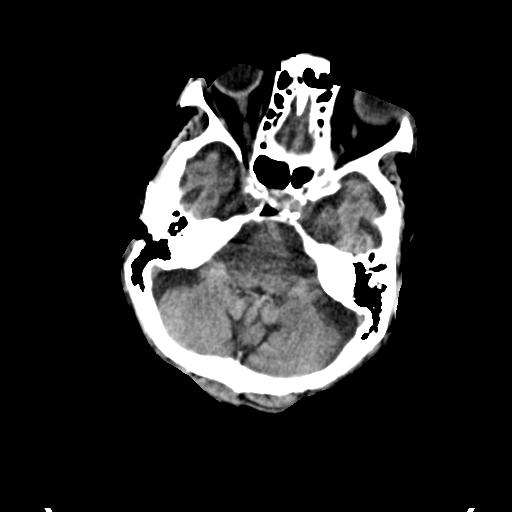
[im 10/29  brain]
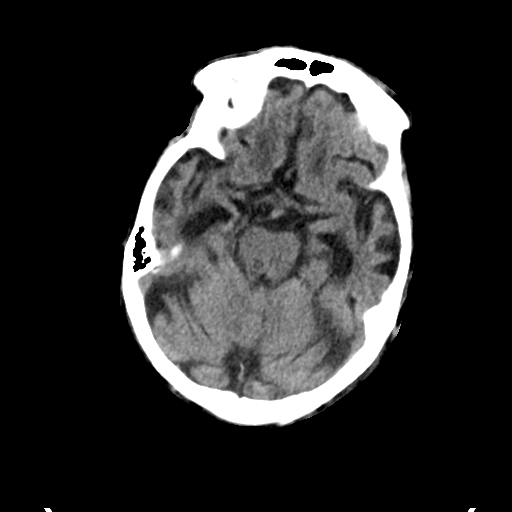
[im 13/29  brain]
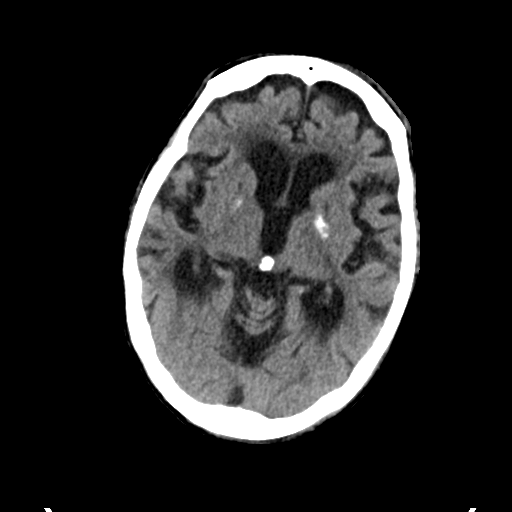
[im 17/29  brain]
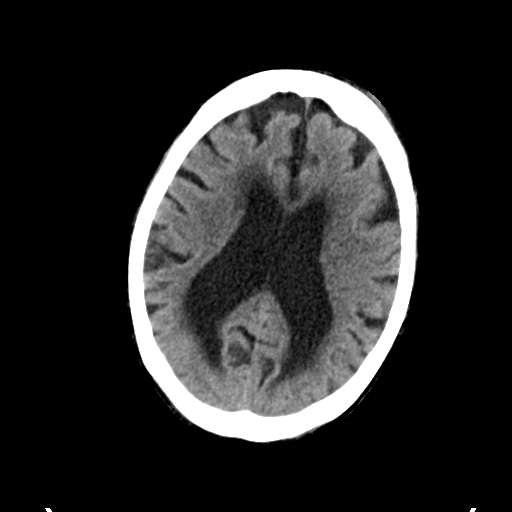
[im 17/29  bone]
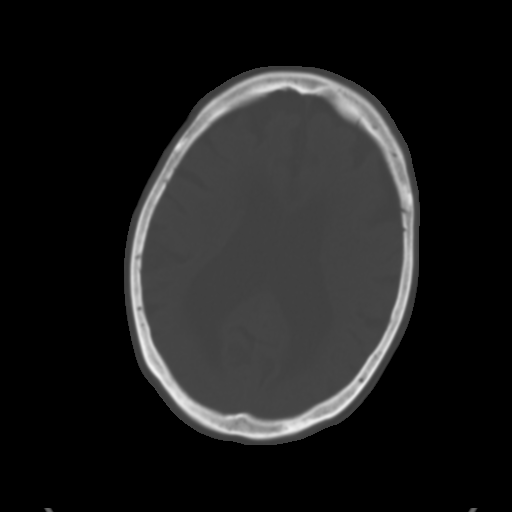
[im 20/29  brain]
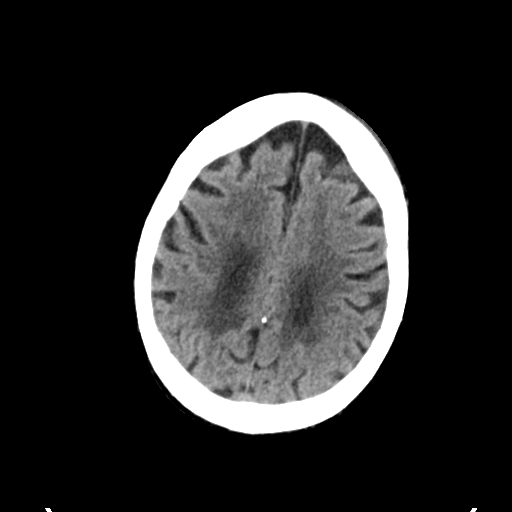
[im 23/29  brain]
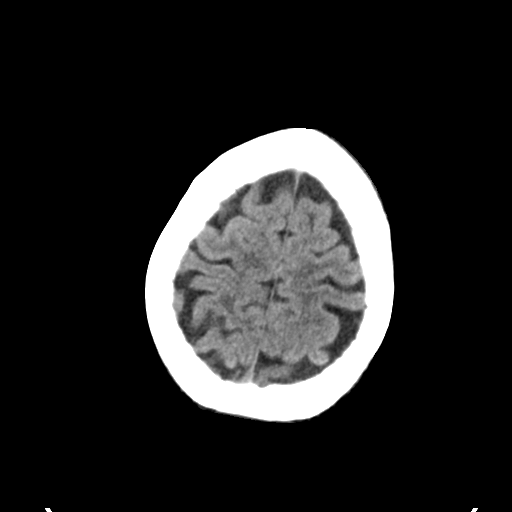
[im 27/29  brain]
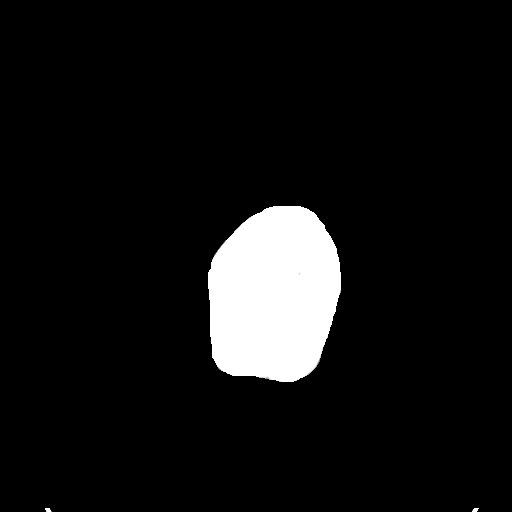

[Series 6: coronal soft tissue · coronal · 0.31mm/px · 3 of 64 slices shown]
[im 22/64  brain]
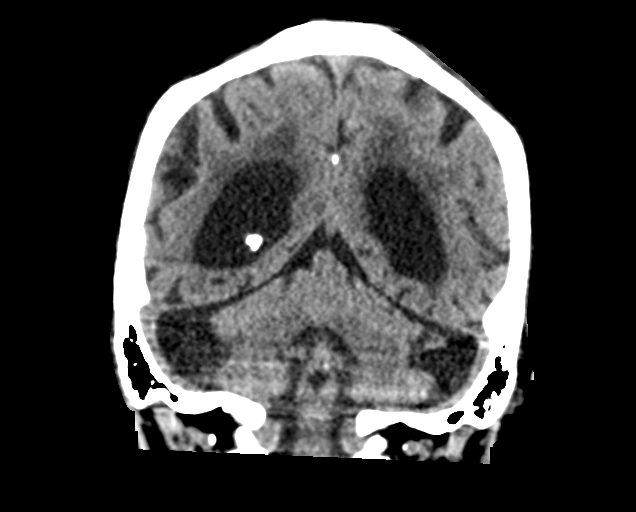
[im 29/64  brain]
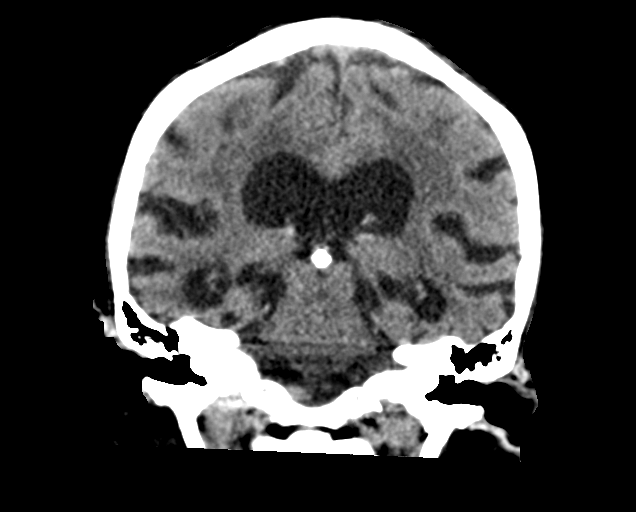
[im 36/64  brain]
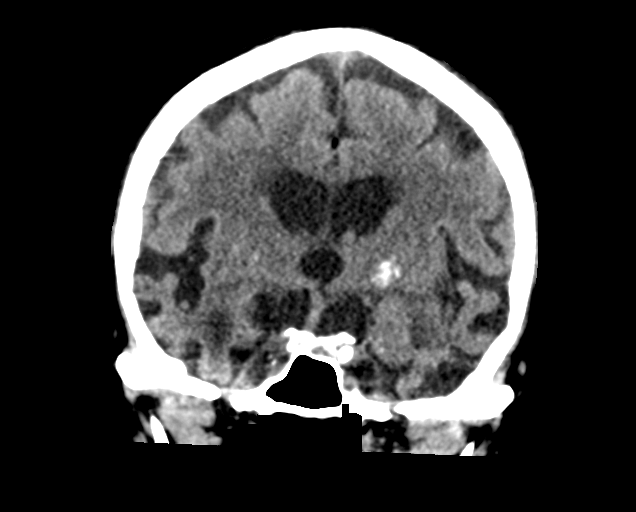

[Series 7: sagittal soft tissue · sagittal · 0.29mm/px · 3 of 51 slices shown]
[im 17/51  brain]
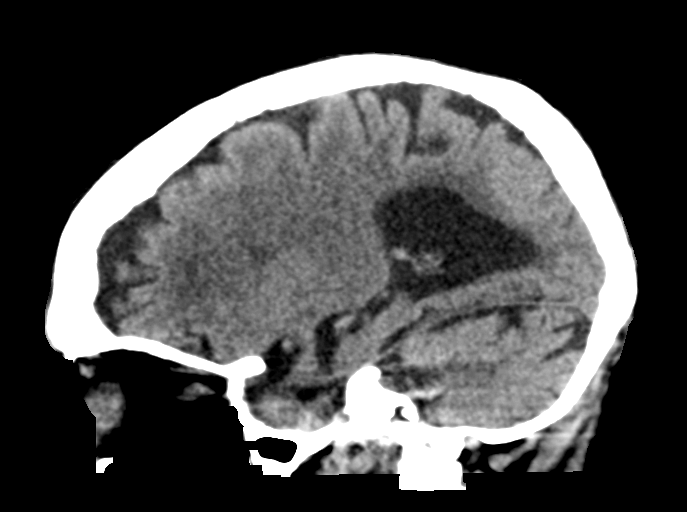
[im 26/51  brain]
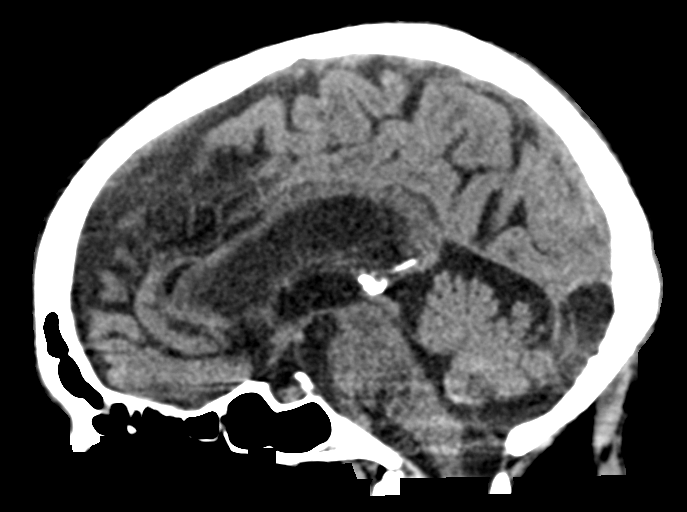
[im 34/51  brain]
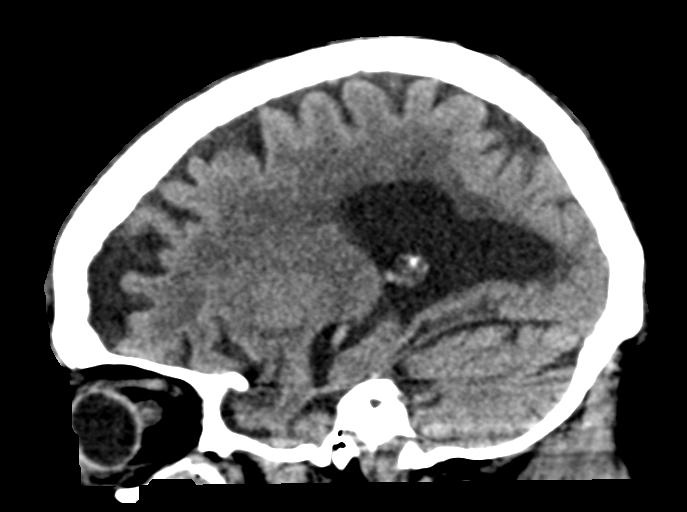

[14 of 46 positions shown; findings below may reference images not displayed]

FINDINGS: CT HEAD FINDINGS

Brain: No evidence of acute infarction, hemorrhage, hydrocephalus,
extra-axial collection or mass lesion/mass effect. Periventricular
and deep white matter hypodensity.

Vascular: No hyperdense vessel or unexpected calcification.

Skull: Normal. Negative for fracture or focal lesion.

Other: None.

CT MAXILLOFACIAL FINDINGS

Osseous: No new fracture or mandibular dislocation. Unchanged,
minimally depressed left orbital floor and anterior and lateral left
maxillary sinus wall fractures, as seen on prior day examination
(series 4, image 46, series 9, image 33). No destructive process.

Orbits: Orbital contents are intact.

Sinuses: Redemonstrated high attenuation air-fluid level in the left
maxillary sinus.

Soft tissues: Soft tissue contusion of the left forehead and left
orbit.
IMPRESSION: 1. No acute intracranial pathology. Small-vessel white matter
disease.
2. Unchanged, minimally depressed left orbital floor and anterior
and lateral left maxillary sinus wall fractures, as seen on prior
day examination.
3. No new fracture.
4. Unchanged blood product in the left maxillary sinus.
5. Soft tissue contusion of the left forehead and left orbit.

## 2023-12-05 IMAGING — DX DG CHEST 1V PORT
1 series · 1 of 1 positions shown · non-contrast
Comparison: CT chest dated 10/24/2021

CLINICAL DATA: Wheezing, dementia

EXAM:
PORTABLE CHEST 1 VIEW

[chest ap]
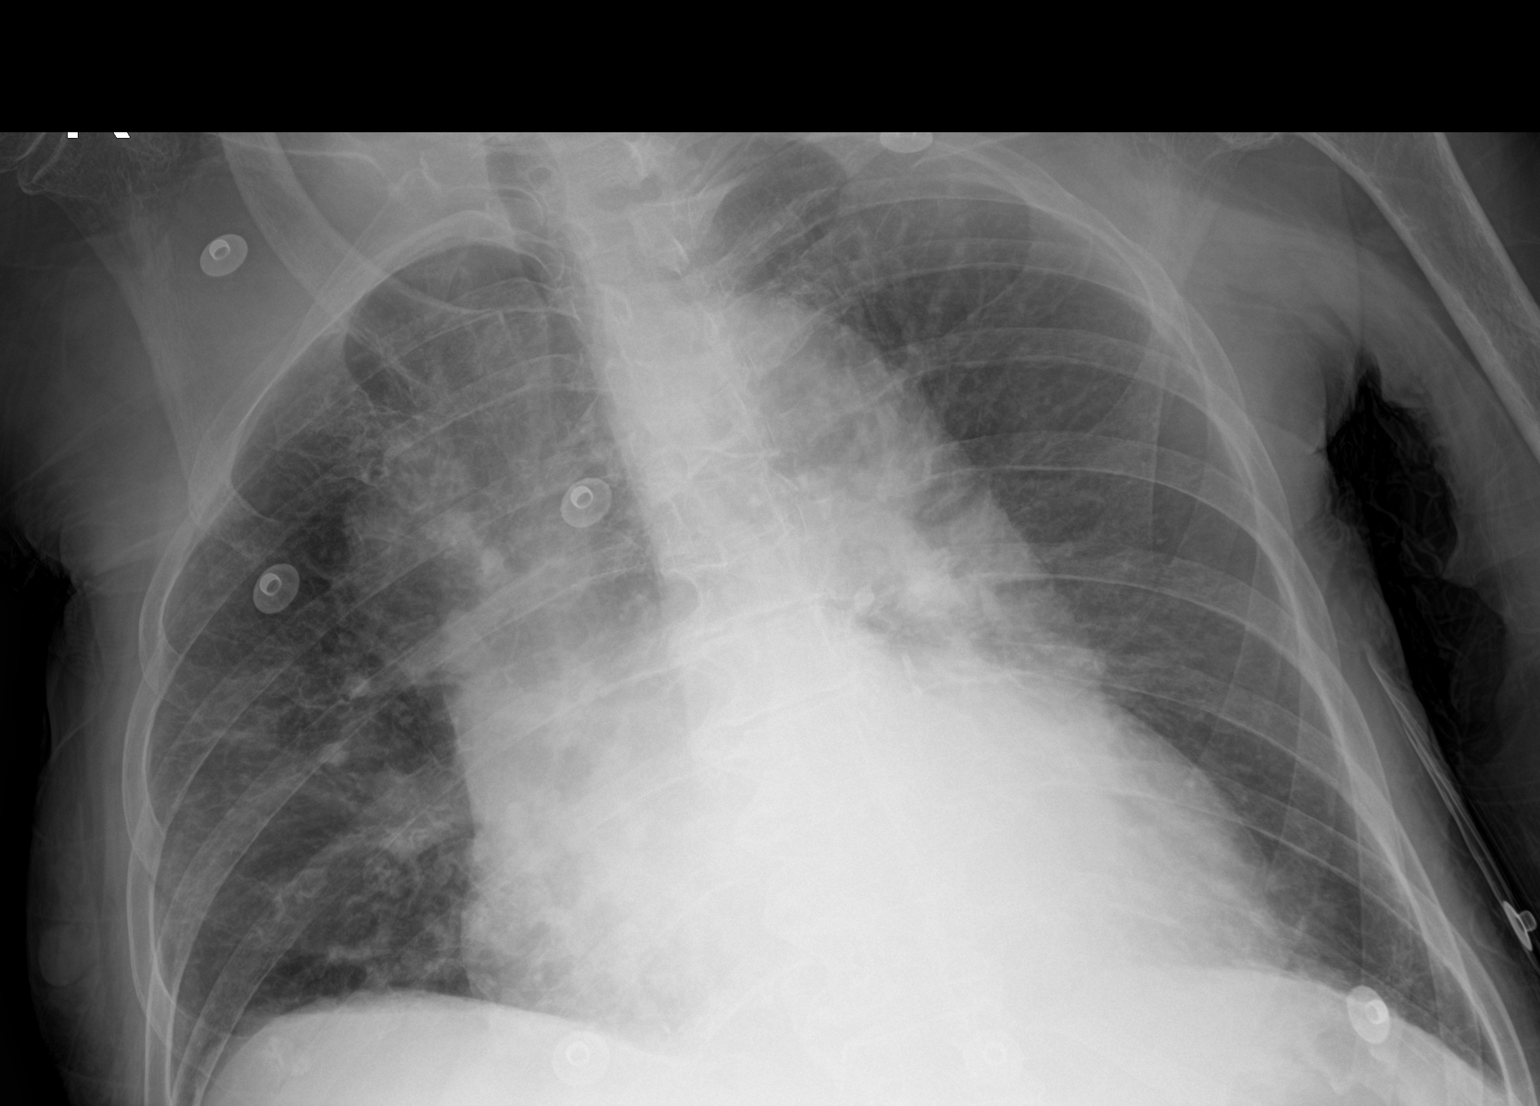

[1 of 1 positions shown; findings below may reference images not displayed]

FINDINGS: Mild right perihilar opacity. Left basilar opacity, likely
atelectasis. No pleural effusion or pneumothorax.

Cardiomegaly.
IMPRESSION: Mild right perihilar and left basilar opacity, likely atelectasis.
Right lower lobe pneumonia is not entirely excluded.

## 2023-12-06 IMAGING — US US RENAL
1 series · 15 of 25 positions shown · non-contrast
Comparison: None.

CLINICAL DATA: Acute kidney injury

EXAM:
RENAL / URINARY TRACT ULTRASOUND COMPLETE

[Series 1: us renal mc & wl · 15 of 56 slices shown]
[im 1/56]
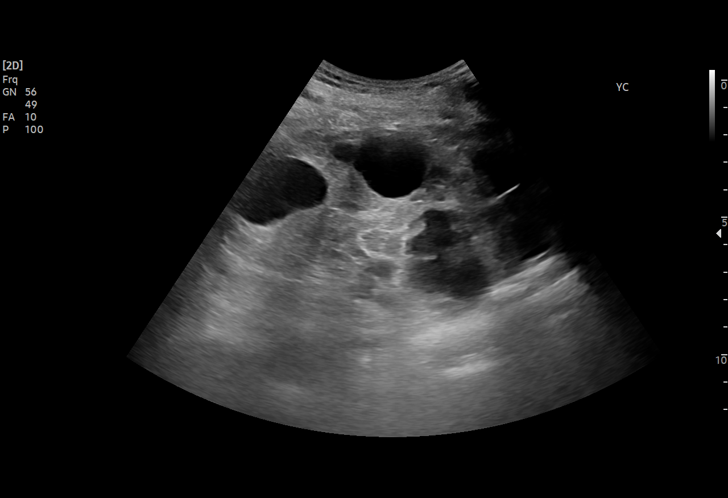
[im 5/56]
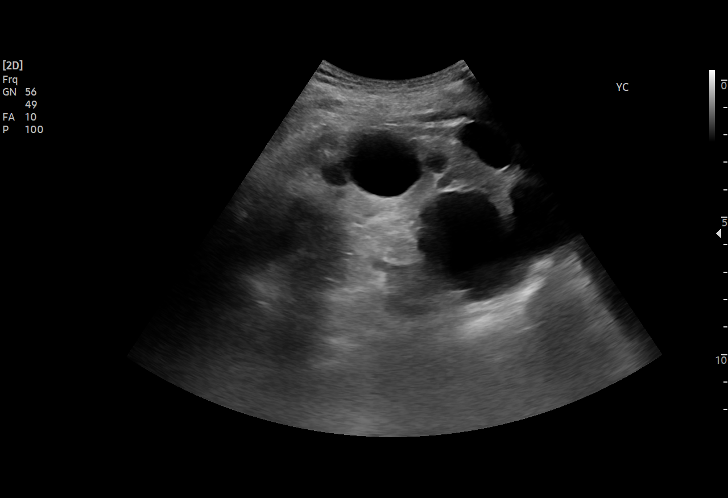
[im 10/56]
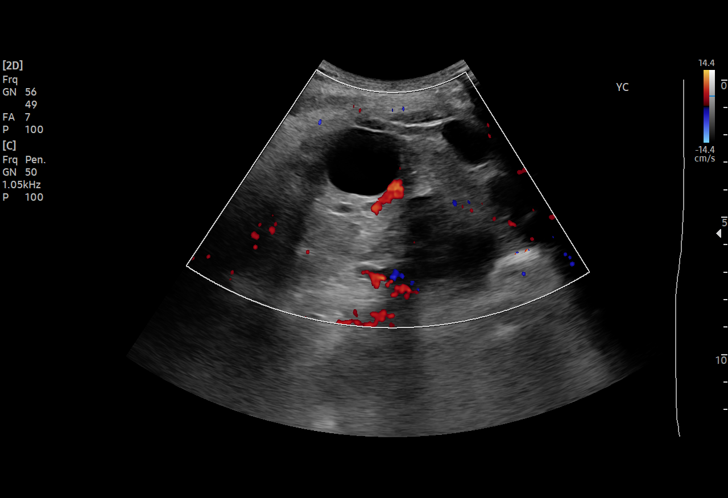
[im 12/56]
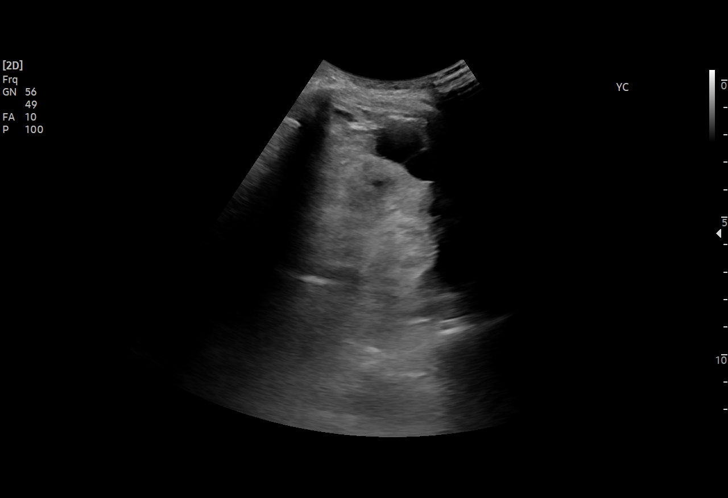
[im 17/56]
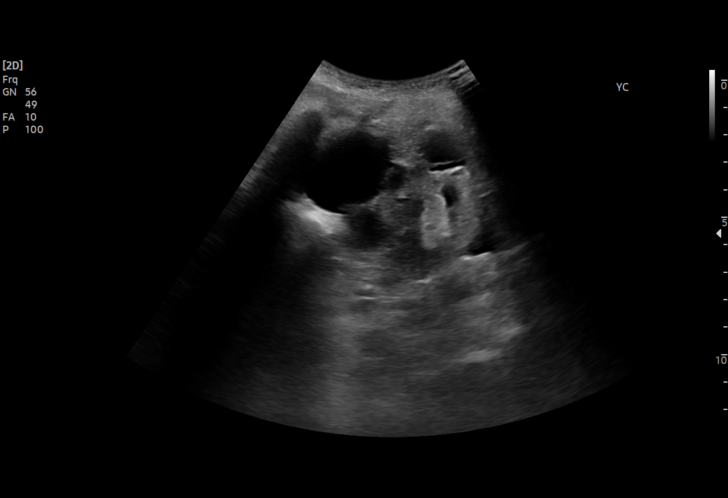
[im 21/56]
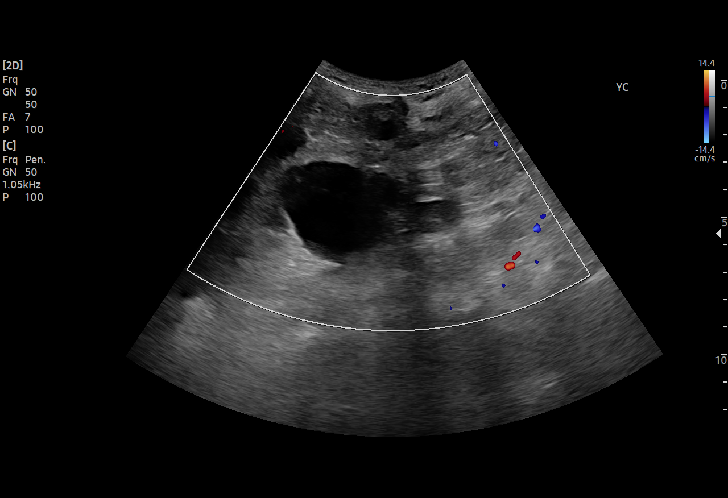
[im 23/56]
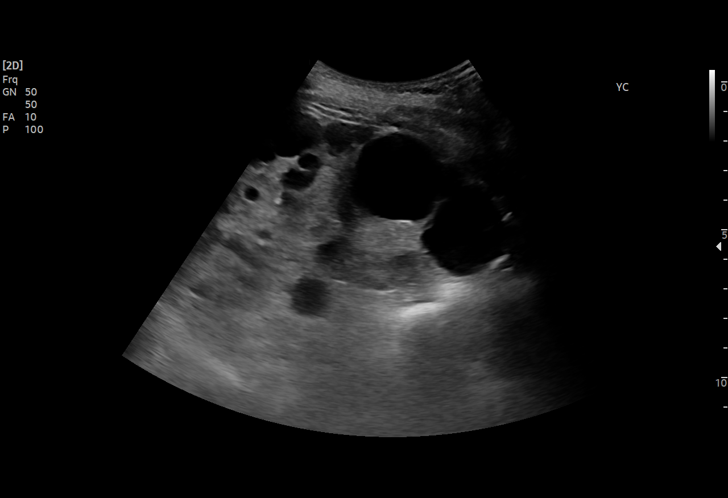
[im 28/56]
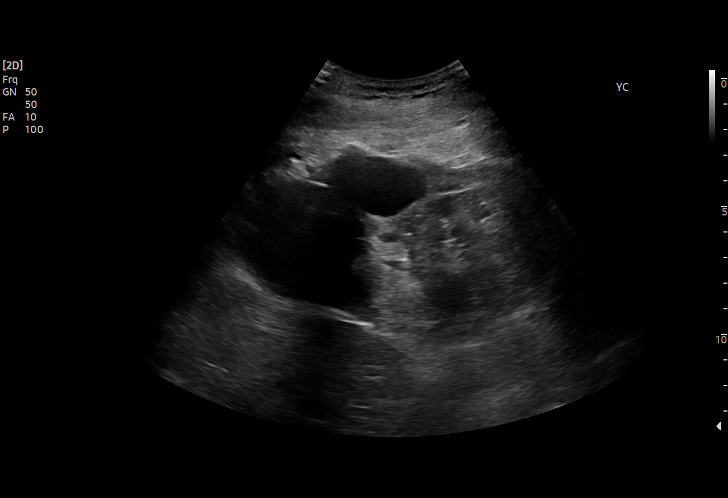
[im 33/56]
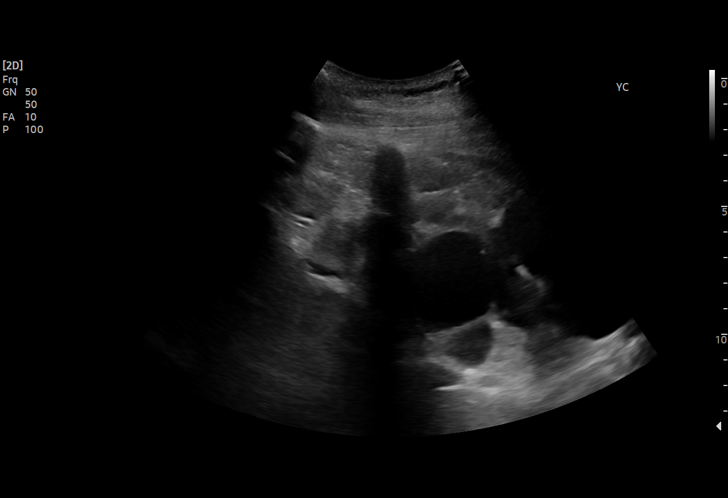
[im 35/56]
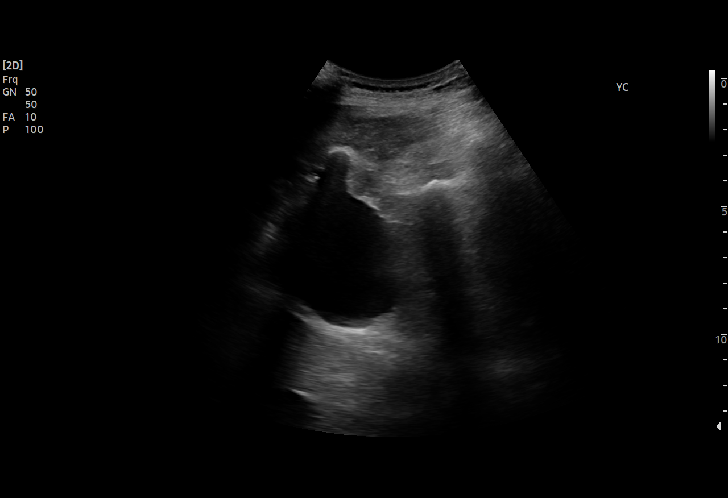
[im 39/56]
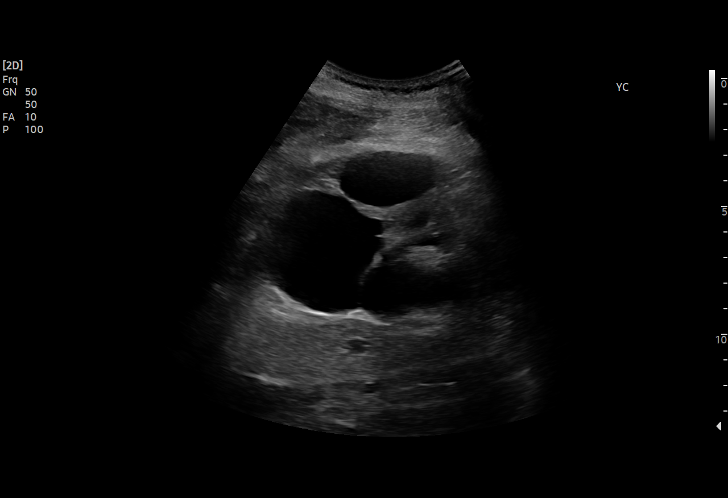
[im 44/56]
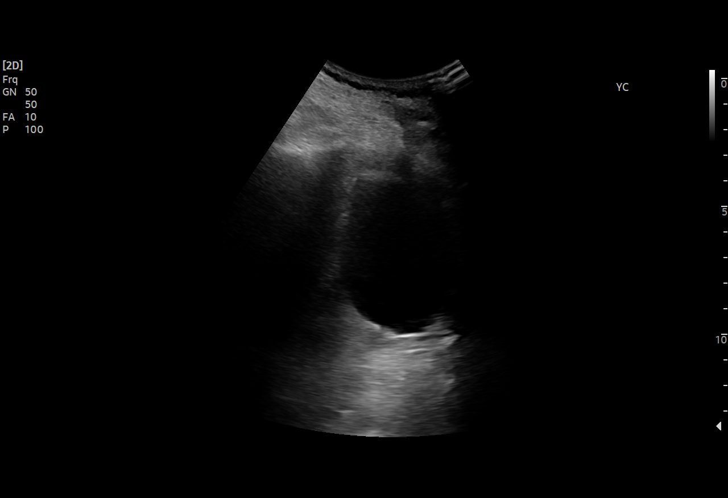
[im 46/56]
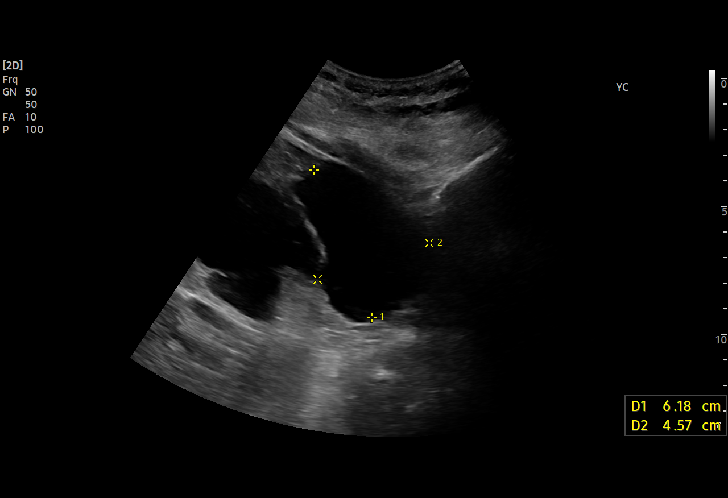
[im 51/56]
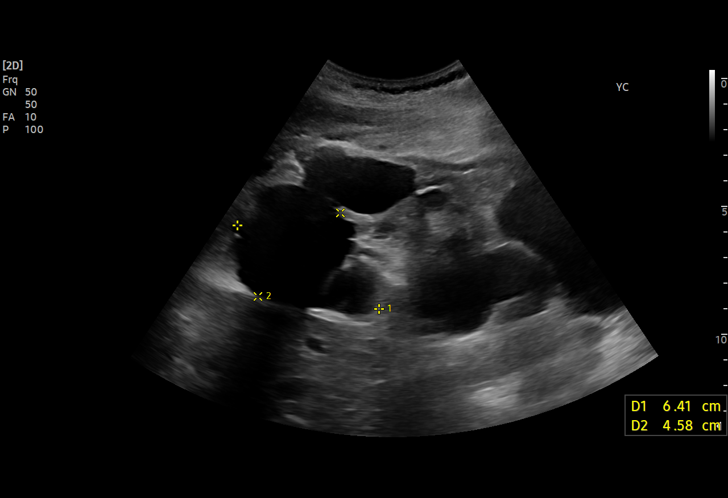
[im 56/56]
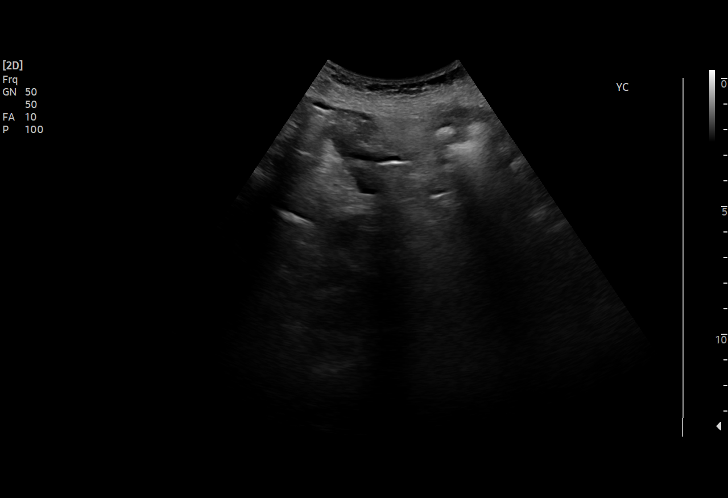

[15 of 25 positions shown; findings below may reference images not displayed]

FINDINGS: Right Kidney:

Polycystic kidney with superimposed echogenic cortex. Renal length
measures up to 13.6 cm due to cystic change. No hydronephrosis. No
detected solid mass

Left Kidney:

Polycystic kidney with superimposed echogenic cortex. Renal length
measures up to 15.5 cm due to cystic change. No hydronephrosis. No
detected solid mass

Bladder:

Not visualized.
IMPRESSION: Polycystic kidneys with underlying medical renal disease. No
hydronephrosis or asymmetry.
